# Patient Record
Sex: Male | Born: 1945 | Race: White | Hispanic: No | Marital: Married
Health system: Midwestern US, Academic
[De-identification: ages and names within clinical notes are randomized; demographics above are authoritative.]

---

## 2016-08-18 MED ORDER — MORPHINE 30 MG PO TBER
30 mg | ORAL_TABLET | Freq: Two times a day (BID) | ORAL | 0 refills | 7.00000 days | Status: DC
Start: 2016-08-18 — End: 2016-09-07

## 2016-08-18 MED ORDER — MORPHINE 15 MG PO TAB
15 mg | ORAL_TABLET | ORAL | 0 refills | 7.00000 days | Status: DC | PRN
Start: 2016-08-18 — End: 2016-09-07

## 2016-08-25 MED ORDER — ZOLEDRONIC AC-MANNITOL-0.9NACL 4 MG/100 ML IV PGBK
4 mg | Freq: Once | INTRAVENOUS | 0 refills | Status: CN
Start: 2016-08-25 — End: ?

## 2016-08-25 MED ORDER — LEUPROLIDE (3 MONTH) 22.5 MG IM SYKT
22.5 mg | Freq: Once | INTRAMUSCULAR | 0 refills | Status: CP
Start: 2016-08-25 — End: ?

## 2016-09-07 MED ORDER — MORPHINE 15 MG PO TAB
15 mg | ORAL_TABLET | ORAL | 0 refills | 7.00000 days | Status: DC | PRN
Start: 2016-09-07 — End: 2016-09-29

## 2016-09-07 MED ORDER — MORPHINE 30 MG PO TBER
30 mg | ORAL_TABLET | Freq: Two times a day (BID) | ORAL | 0 refills | 7.00000 days | Status: DC
Start: 2016-09-07 — End: 2016-09-29

## 2016-09-29 MED ORDER — ZOLEDRONIC AC-MANNITOL-0.9NACL 4 MG/100 ML IV PGBK
4 mg | Freq: Once | INTRAVENOUS | 0 refills | Status: CP
Start: 2016-09-29 — End: ?

## 2016-09-29 MED ORDER — MORPHINE 30 MG PO TBER
30 mg | ORAL_TABLET | Freq: Two times a day (BID) | ORAL | 0 refills | 7.00000 days | Status: DC
Start: 2016-09-29 — End: 2016-11-05

## 2016-09-29 MED ORDER — MORPHINE 15 MG PO TAB
15 mg | ORAL_TABLET | ORAL | 0 refills | 7.00000 days | Status: DC | PRN
Start: 2016-09-29 — End: 2016-11-05

## 2016-10-14 MED ORDER — ZOLEDRONIC AC-MANNITOL-0.9NACL 4 MG/100 ML IV PGBK
4 mg | Freq: Once | INTRAVENOUS | 0 refills | Status: CN
Start: 2016-10-14 — End: ?

## 2016-10-16 ENCOUNTER — Encounter: Admit: 2016-10-16 | Discharge: 2016-10-16 | Payer: MEDICARE

## 2016-10-16 NOTE — Telephone Encounter
Patients wife called to report patients weight on 5/21 was 216 lbs and today is 243 lbs.  She asks why this is and what to do.  This nurse spoke with NP  Jeanine Luz who said patient needs to be evaluated.  If patient has any symptoms over the weekend he should go directly to the emergency room, I.e. chest pains, shortness of breath.  Patient as an appointment with Dr. Mickie Hillier on Tuesday, 10/20/16.  Patients wife voiced understanding.

## 2016-10-20 ENCOUNTER — Encounter: Admit: 2016-10-20 | Discharge: 2016-10-20 | Payer: MEDICARE

## 2016-10-20 NOTE — Telephone Encounter
RN called patient and left VM to make sure he went to Emergency room since he was not at [facility] per their medical records.

## 2016-10-21 ENCOUNTER — Encounter: Admit: 2016-10-21 | Discharge: 2016-10-21 | Payer: MEDICARE

## 2016-10-21 NOTE — Telephone Encounter
Patient's wife apparently called and spoke with a scheduler.   She apparently requested and "emergency" appt and advised the scheduler ER was "useless" and she apparently refused to advise the scheduler what the appt was for.   I attempted to contact the patient to ascertain the problem, but had to leave a message.

## 2016-10-21 NOTE — Telephone Encounter
According to [facility] records, patient was admitted yesterday and a call to the facility confirmed he is still there.

## 2016-10-22 ENCOUNTER — Encounter: Admit: 2016-10-22 | Discharge: 2016-10-22 | Payer: MEDICARE

## 2016-10-22 DIAGNOSIS — I890 Lymphedema, not elsewhere classified: Principal | ICD-10-CM

## 2016-10-26 ENCOUNTER — Encounter: Admit: 2016-10-26 | Discharge: 2016-10-26 | Payer: MEDICARE

## 2016-10-26 NOTE — Telephone Encounter
Hospital Discharge Follow Up      Reached Patient:Yes     Admission Information:     Hospital Name: [facility]  Admission Date: 10/20/16  Discharge Date: 10/23/16  Discharge Diagnosis: UTI , Hematuria, Renal insufficiency, Peripheral edema, Prostate cancer  Was this a readmission? No  If yes, reason:   Hospital Services: Unplanned  Today's call is 1(business) days post discharge      Discharge Instruction Review   Did patient receive and understand discharge instructions? Yes    Home Health ordered? No                 Agency name/telephone number:    Has HH agency contacted patient? No   Caregiver assistance in the home? Yes   Are there concerns regarding the patient's ADL'S? No  Is patient a fall risk? Yes    Special diet? No If yes, type: pt states he has no appetitie      Medication Reconciliation    Changes to pre-hospital medications? Yes  Were new prescriptions filled?Yes, MSIR 15 mg prn, and Morphine ER, Ibuprofen    Understanding Condition   Having any current symptoms? Yes, Pain at back, legs, abdomen not controlled by current regimine of Morphine and Ibuprofen.  Pt states he gets 'a bit of relief by bedtime'. Pt is communicating with Dr. Houston Siren for pain management. Pt states he has swelling 'from chest down'., and is taking Lasix qam. Pt states he entered the hospital @246 #, and today weighs 225#. Pt has sob w/activity. Pt denies nausea,  but 'I don't want to eat anything'. Pt states he is voiding 'can't tell' if there is blood in his urine. Pt states his stools are loose r/t IV antibiotics recieved while inpatient for a UTI.  Patient understands when to seek additional medical care? Yes   Other instructions provided : Action in the event of worsening or new symptoms, resources including Dr. West Pugh, and Dr. Germaine Pomfret.      Scheduling Follow-up Appointment   Upcoming appointment date and time and with whom scheduled: Future Appointments Date Time Provider Department Center   10/29/2016 3:00 PM [facility] None   10/29/2016 3:30 PM Lurline Idol, APRN [facility] [place] Exam   11/04/2016 1:30 PM Moishe Spice, PhD [facility] Exam     PCP appointment scheduled?No, patient declined appointment  PCP primary location: [facility]  Specialist appointment scheduled? Yes, with Oncology Mikala [facility] 10/29/16  Both PCP and Specialist appointment scheduled: No  Is assistance with transportation needed?No

## 2016-10-29 ENCOUNTER — Encounter: Admit: 2016-10-29 | Discharge: 2016-10-29 | Payer: MEDICARE

## 2016-10-29 NOTE — Telephone Encounter
Patient called to report having 7 stools since 2:00 am this morning.  They are not watery and also not formed.  He has taken 4 Lomotil with no results. Patient denies fever but says he was cold last night and had the shakes. He is not eating much but is drinking.  Dr. Mickie Hillier notified and ordered [place].  Patient is allergic to codeine and tapentadol.  Dr. Mickie Hillier then said to use Pepto Bismol, probiotics and to eat yogurt. This nurse called patient and he has Tincture of Opium but hasn't used it.  He takes probiotics and is allergic to  milk so will not eat yogurt.  This nurse told him to take the Tincture of Opium.  Patient has an appointment at 3:00 pm with NP, Mikala and will try to keep that appointment if he can.

## 2016-11-04 ENCOUNTER — Encounter: Admit: 2016-11-04 | Discharge: 2016-11-04 | Payer: MEDICARE

## 2016-11-04 DIAGNOSIS — C61 Malignant neoplasm of prostate: ICD-10-CM

## 2016-11-04 DIAGNOSIS — C7951 Secondary malignant neoplasm of bone: Principal | ICD-10-CM

## 2016-11-04 NOTE — Progress Notes
Patient name:  Allison Silva  DOB:    July 05, 1945  MRN:    [id number]  Date of service:  May 9th 2018, June 20th 2018    Reason for referral: Personal and family history of cancer.      Referring physician: Dr Christen Bame.    Nyaire is a 71 year old man referred to Genetics for evaluation of inherited cancer syndromes. Delante was diagnosed with prostate cancer @71  with a Gleason score of 10. 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 He has had colonoscopies with 2-3 polyps.      The following germline genetic test was performed:  [facility] Multi-cancer panel: ALK, APC, ATM, AXIN2, BAP1, BARD1, BLM, BMPR1A, BRCA1, BRCA2, BRIP1, CASR, CDC73, CDH1, CDK4, CDKN1B, CDKN1C, CDKN2A (p14ARF), CDKN2A (p16INK4a), CEBPA, CHEK2, CTNNA1, DICER1, DIS3L2, EGFR, EPCAM (Deletion/duplication testing only), FH, FLCN, GATA2, GPC3, GREM1 (Promoter region deletion/duplication testing only), HOXB13, HRAS, KIT, MAX, MEN1, MET, MITF, MLH1, MSH2, MSH6, MUTYH, NBN, NF1, NF2, PALB2, PDGFRA, PHOX2B, PMS2, POLD1, POLE, POT1, PRKAR1A, PTCH1, PTEN, RAD50, RAD51C, RAD51D, RB1, RECQL4, RET, RUNX1, SDHA, SDHAF2, SDHB, SDHC, SDHD, SMAD4, SMARCA4, SMARCB1, SMARCE1, STK11, SUFU, TERC, TERT, TMEM127, TP53, TSC1, TSC2, VHL, WRN, WT1.    The test identified three variants of uncertain significance as follows:  FLCN  c.1274A>G  p.Gln425Arg  heterozygous  MSH6  c.3758T>A  p.Val1253Glu  heterozygous  TSC1  c.2723G>A  p.Arg908Gln  heterozygous    Since these changes are VUSs we do not advise altering current clinical management based on this result. We advise no further testing of other family members. As and when this variant is reclassified we will advise the patient accordingly. It is also possible to monitor the status of this variant using ClinVar ([email or web address]).We would like to emphasize that all of the other genes on the panel tested negative.    No pathogenic variants were detected by sequencing in any of the other genes on this panel. No large deletions or duplications were identified by array [facility].  This negative result does not exclude a genetic basis for the reported personal and/or family history of cancer in this patient. It is possible that this patient has a pathogenic mutation that is not detectable by this analysis or in a gene that is not included on the panel.     Counseling performed by:  Moishe Spice PhD MS [facility],   Clinical Assistant Professor,   Board Certified Genetic Counselor,   [email or web address]

## 2016-11-05 ENCOUNTER — Encounter: Admit: 2016-11-05 | Discharge: 2016-11-05 | Payer: MEDICARE

## 2016-11-05 DIAGNOSIS — F4310 Post-traumatic stress disorder, unspecified: ICD-10-CM

## 2016-11-05 DIAGNOSIS — Z09 Encounter for follow-up examination after completed treatment for conditions other than malignant neoplasm: ICD-10-CM

## 2016-11-05 DIAGNOSIS — R300 Dysuria: ICD-10-CM

## 2016-11-05 DIAGNOSIS — I890 Lymphedema, not elsewhere classified: ICD-10-CM

## 2016-11-05 DIAGNOSIS — G893 Neoplasm related pain (acute) (chronic): Secondary | ICD-10-CM

## 2016-11-05 DIAGNOSIS — Z87442 Personal history of urinary calculi: Principal | ICD-10-CM

## 2016-11-05 DIAGNOSIS — H9190 Unspecified hearing loss, unspecified ear: ICD-10-CM

## 2016-11-05 DIAGNOSIS — M1990 Unspecified osteoarthritis, unspecified site: ICD-10-CM

## 2016-11-05 DIAGNOSIS — K580 Irritable bowel syndrome with diarrhea: ICD-10-CM

## 2016-11-05 DIAGNOSIS — C7951 Secondary malignant neoplasm of bone: ICD-10-CM

## 2016-11-05 DIAGNOSIS — C61 Malignant neoplasm of prostate: Principal | ICD-10-CM

## 2016-11-05 DIAGNOSIS — M549 Dorsalgia, unspecified: ICD-10-CM

## 2016-11-05 DIAGNOSIS — K319 Disease of stomach and duodenum, unspecified: ICD-10-CM

## 2016-11-05 DIAGNOSIS — R600 Localized edema: ICD-10-CM

## 2016-11-05 LAB — CBC AND DIFF
Lab: 1 %
Lab: 14 % (ref 11–15)
Lab: 17 % — ABNORMAL HIGH (ref 0–10)
Lab: 2 %
Lab: 2 % (ref >60)
Lab: 26 % (ref 24–44)
Lab: 29 pg (ref 26–34)
Lab: 33 g/dL (ref 32.0–36.0)
Lab: 34 % — ABNORMAL LOW (ref 40–50)
Lab: 399 10*3/uL — ABNORMAL HIGH (ref 150–400)
Lab: 4 M/UL — ABNORMAL LOW (ref 4.4–5.5)
Lab: 4.4 10*3/uL (ref 1.8–7.0)
Lab: 46 % (ref 41–77)
Lab: 6 % (ref >60)
Lab: 7.1 10*3/uL (ref 4.5–11.0)
Lab: 7.4 FL (ref 7–11)

## 2016-11-05 MED ORDER — FLUOCINONIDE 0.05 % TP CREA
1 refills | Status: AC | PRN
Start: 2016-11-05 — End: ?

## 2016-11-05 MED ORDER — MORPHINE 15 MG PO TBER
15 mg | ORAL_TABLET | Freq: Two times a day (BID) | ORAL | 0 refills | 7.00000 days | Status: AC
Start: 2016-11-05 — End: 2017-01-05

## 2016-11-05 MED ORDER — MORPHINE 30 MG PO TBER
30 mg | ORAL_TABLET | Freq: Two times a day (BID) | ORAL | 0 refills | 7.00000 days | Status: AC
Start: 2016-11-05 — End: 2016-12-01

## 2016-11-05 MED ORDER — MORPHINE 15 MG PO TAB
15 mg | ORAL_TABLET | ORAL | 0 refills | 7.00000 days | Status: AC | PRN
Start: 2016-11-05 — End: 2016-12-01

## 2016-11-05 NOTE — Progress Notes
Name: Jose Hampton          MRN: [id number]      DOB: [birth year]      AGE: 71 y.o.   DATE OF SERVICE: 11/05/2016    Subjective:             Reason for Visit:  Heme/Onc Care      Jose Hampton is a 71 y.o. male.     Cancer Staging  No matching staging information was found for the patient.    History of Present Illness  71 year old male patient of Dr. Lowella Dell who comes in for hospital follow up. He was in [facility] from 10/20/16-10/23/16 for lymphedema, had declined diuretics as outpatient because he thought that the Lasix caused constipation. He uses a lymphedema suit from the [place] but had gained 30 pounds in the last month and could not actually fit into the suit. Admitted under observation. Received 2 days of IV Lasix with 15 pound weight loss. Upon discharge, will continue Lasix 40 mg by mouth daily. Electrolytes remained stable. Creatinine actually improved with diuresis. Described urinary retention, on finasteride. Flomax added. He has follow-up with urology already in place in 1 month. He comes in today with his wife. He reports some dysuria-which has been going on since 10/05/16. Reportedly had a UTI in [facility] and was given IV antibiotics. His swelling in his legs is better but reports his pain is not adequately controlled.     He is followed by Dr. Lowella Dell for metastatic prostate cancer. He started a 30 day course of casodex (started 02/26/16) and began therapy with Lupron on March 10, 2016.  He was instructed to start calcium vitamin D.  Patient started docetaxel chemotherapy on 03/16/16.  The plan was for a total of 6 cycles of docetaxel therapy. PSA was 22.62 on 03/02/16. CT abdomen and pelvis from 06/09/16 showed widespread sclerotic osseous metastatic disease, Normal size prostate with no abdominopelvic lymphadenopathy, nonobstructing right renal calculus, and multiple calcifications noted along the posterior wall the urinary bladder which was incompletely distended. These likely represent multiple small layering bladder calculi. However, partial calcified posterior bladder wall mass cannot be excluded as well as mild colonic diverticulosis and small hiatal hernia. Due to intense swelling, stopped the docetaxel. PET scan from 07/10/16 reported ???low level FDG uptake within minimal retroperitoneal soft tissue thickening, which is not significantly changed in size since CT abdomen pelvis 06/09/2016 and is favored to reflect small reactive lymph nodes or treated disease, no thoracic adenopathy, widespread osteosclerotic metastatic disease, some of which demonstrate low level increased FDG uptake and nonobstructing right renal calculus and redemonstration of multiple   layering calcifications dependently or along the posterior bladder wall  favored to reflect bladder calculi. PSA from 09/23/16 was 0.20.        Review of Systems   Constitutional: Positive for activity change, appetite change, diaphoresis and fatigue.   HENT: Negative.    Eyes: Negative.    Respiratory: Negative.    Cardiovascular: Positive for leg swelling.   Gastrointestinal: Positive for abdominal pain, constipation and diarrhea.   Genitourinary: Negative.    Musculoskeletal: Positive for gait problem.   Skin: Negative.         dry   Neurological: Positive for weakness.   Psychiatric/Behavioral: Negative.          Objective:         ??? ciclopirox (LOPROX) 0.77 % topical cream Apply  to affected area twice daily.   ??? colesevelam(+) (  WELCHOL) 625 mg tablet Take 3 Tabs by mouth twice daily with meals.   ??? dicyclomine (BENTYL) 10 mg capsule Take 10 mg by mouth four times daily.   ??? finasteride (PROSCAR) 5 mg tablet Take 5 mg by mouth daily.   ??? fluocinonide (LIDEX) 0.05 % topical cream Apply to affected area as needed.   ??? Hydrocortisone-Pramoxine (PRAMOSONE) 2.5-1 % oint Apply  to affected area as Needed (apply sparingly to rectum).   ??? ketotifen(+) (ZADITOR) 0.025 % (0.035 %) ophthalmic solution Apply 1 Drop to both eyes twice daily. ??? LACTOBACILLUS ACIDOPHILUS (ACIDOPHILUS PO) Take  by mouth.   ??? morphine IR (MS-IR) 15 mg tablet Take 1 tablet by mouth every 3 hours as needed for Pain (for pain)   ??? morphine SR (MS CONTIN) 15 mg tablet Take 1 tablet by mouth every 12 hours   ??? morphine SR (MS CONTIN; ORAMORPH SR) 30 mg ER tablet Take 1 tablet by mouth every 12 hours   ??? ondansetron (ZOFRAN) 8 mg tablet Take 1 tablet by mouth every 8 hours as needed for Nausea or Vomiting.   ??? Sulfacetamide Sodium-Sulfur 10-5 % (w/v) lotn Apply  topically to affected area.   ??? zolpidem (AMBIEN) 10 mg tablet Take 10 mg by mouth at bedtime as needed for Sleep.     Vitals:    11/05/16 1323   BP: 98/62   Pulse: 73   Resp: 18   Temp: 36.7 ???C (98.1 ???F)   TempSrc: Oral   SpO2: 98%   Weight: 98.6 kg (217 lb 6.4 oz)   Height: 175.3 cm (69.02)     Body mass index is 32.09 kg/m???.     Pain Score: Ten  Pain Loc: Hip (neck and legs)      Pain Addressed:  Prescription provided for pain management    Patient Evaluated for a Clinical Trial: Patient not eligible for a treatment trial (including not needing treatment, needs palliative care, in remission).     [place] Cooperative Oncology Group performance status is 1, Restricted in physically strenuous activity but ambulatory and able to carry out work of a light or sedentary nature, e.g., light house work, office work.     Physical Exam   Constitutional: He is oriented to person, place, and time. He appears well-developed and well-nourished. No distress.   HENT:   Head: Normocephalic and atraumatic.   Mouth/Throat: Oropharynx is clear and moist.   Eyes: EOM are normal. Pupils are equal, round, and reactive to light.   Neck: Normal range of motion. Neck supple.   Cardiovascular: Normal rate and normal heart sounds.    Pulmonary/Chest: Effort normal and breath sounds normal.   Abdominal: Soft. Bowel sounds are normal.   Musculoskeletal: Normal range of motion. He exhibits edema (1+ bilateral). Neurological: He is alert and oriented to person, place, and time.   Skin: Skin is warm and dry.   Thickened skin from chronic edema on both lower legs. Some cracking and peeling at posterior shins, no drainage, less painful   Psychiatric: He has a normal mood and affect. His behavior is normal. Judgment and thought content normal.     CBC w/Diff    Lab Results   Component Value Date/Time    WBC 7.1 11/05/2016 01:15 PM    RBC 4.04 (L) 11/05/2016 01:15 PM    HGB 11.7 (L) 11/05/2016 01:15 PM    HCT 34.9 (L) 11/05/2016 01:15 PM    MCV 86.5 11/05/2016 01:15 PM    MCH  29.0 11/05/2016 01:15 PM    MCHC 33.6 11/05/2016 01:15 PM    RDW 14.8 11/05/2016 01:15 PM    PLTCT 399 11/05/2016 01:15 PM    MPV 7.4 11/05/2016 01:15 PM    Lab Results   Component Value Date/Time    NEUT 59 09/23/2016 01:18 PM    ANC 4.48 11/05/2016 01:15 PM    ANC 3.10 09/23/2016 01:18 PM    LYMA 25 09/23/2016 01:18 PM    ALC 1.30 09/23/2016 01:18 PM    MONA 13 (H) 09/23/2016 01:18 PM    AMC 0.70 09/23/2016 01:18 PM    EOSA 3 09/23/2016 01:18 PM    AEC 0.20 09/23/2016 01:18 PM    BASA 0 09/23/2016 01:18 PM    ABC 0.00 09/23/2016 01:18 PM      U/A pending             Assessment and Plan:  1. Metastatic prostate cancer. Due for Lupron on 7/3 (q 3 months). Patient started docetaxel chemotherapy on 03/16/16.  The plan was for a total of 6 cycles of docetaxel therapy. PSA was 22.62 on 03/02/16 Due to intense swelling, stopped the docetaxel. PSA from 09/23/16 was 0.20.PSA pending today. Will order CT AP and PET and have him follow up with Dr. Lowella Dell. He prefers scans at [facility] as the table is easier on his back. Follow up with Dr. Lowella Dell after scans. Future appointments in place. .  2. Lower leg edema with recent hospital stay (6/5-6/8). Much improvement in edema. Able to wear compression device. Will wean down to 20 mg daily. CMP pending.   3. Kidney stones; Patient had surgery to remove bladder stones by urology on 08/28/16.Repeatd again on 10/05/16. 4. Dysuria; since cysto on 5/21, recently completed treatment for UTI in hospital. On Flomax and proscar. Will check UA today  5. Pain; reports MS contin 30 mg not strong enough, but 60 mg too strong. Will provide him with an additional 15 mg MS Contin to bring him up to 45 mg BID. Continue MSIR for breakthrough   6. Bone metastasis. Got one dose of Zometa on 09/29/16, reports that caused worsening swelling of arms and legs. Does not want more at this time. Will discuss with Dr. Lowella Dell about other options. Remains on vitamin D with calcium.     Patient verbalized understanding and agreement with the plan.

## 2016-11-06 ENCOUNTER — Encounter: Admit: 2016-11-06 | Discharge: 2016-11-06 | Payer: MEDICARE

## 2016-11-06 LAB — URINALYSIS DIPSTICK
Lab: 1 U/L (ref 1.003–1.035)
Lab: 6 U/L (ref 5.0–8.0)
Lab: NEGATIVE meq/L (ref 21–32)
Lab: NEGATIVE mg/dL (ref 0.40–1.00)
Lab: NEGATIVE
Lab: NEGATIVE
Lab: NEGATIVE

## 2016-11-06 LAB — URINALYSIS, MICROSCOPIC

## 2016-11-06 LAB — PROSTATIC SPECIFIC ANTIGEN-PSA: Lab: 1 ng/mL — ABNORMAL LOW (ref <4.01)

## 2016-11-06 LAB — COMPREHENSIVE METABOLIC PANEL
Lab: 140 MMOL/L (ref 137–147)
Lab: 4.1 MMOL/L (ref 3.5–5.1)

## 2016-11-06 LAB — TESTOSTERONE,TOTAL: Lab: <10 ng/dL — ABNORMAL LOW (ref 270–1070)

## 2016-11-12 ENCOUNTER — Encounter: Admit: 2016-11-12 | Discharge: 2016-11-12 | Payer: MEDICARE

## 2016-11-12 DIAGNOSIS — C7951 Secondary malignant neoplasm of bone: ICD-10-CM

## 2016-11-12 DIAGNOSIS — C61 Malignant neoplasm of prostate: Principal | ICD-10-CM

## 2016-11-17 ENCOUNTER — Encounter: Admit: 2016-11-17 | Discharge: 2016-11-17 | Payer: MEDICARE

## 2016-11-17 DIAGNOSIS — C61 Malignant neoplasm of prostate: Principal | ICD-10-CM

## 2016-11-17 DIAGNOSIS — Z79818 Long term (current) use of other agents affecting estrogen receptors and estrogen levels: ICD-10-CM

## 2016-11-17 DIAGNOSIS — C7951 Secondary malignant neoplasm of bone: ICD-10-CM

## 2016-11-17 MED ORDER — LEUPROLIDE (3 MONTH) 22.5 MG IM SYKT
22.5 mg | Freq: Once | INTRAMUSCULAR | 0 refills | Status: CN
Start: 2016-11-17 — End: ?

## 2016-11-17 MED ORDER — LEUPROLIDE (3 MONTH) 22.5 MG IM SYKT
22.5 mg | Freq: Once | INTRAMUSCULAR | 0 refills | Status: CP
Start: 2016-11-17 — End: ?
  Administered 2016-11-17: 20:00:00 22.5 mg via INTRAMUSCULAR

## 2016-11-17 NOTE — Progress Notes
Pt presents today for injection. Pt tolerated without difficulty. Pt released in stable condition.

## 2016-11-20 ENCOUNTER — Encounter: Admit: 2016-11-20 | Discharge: 2016-11-20 | Payer: MEDICARE

## 2016-11-20 DIAGNOSIS — C7951 Secondary malignant neoplasm of bone: ICD-10-CM

## 2016-11-20 DIAGNOSIS — C61 Malignant neoplasm of prostate: Principal | ICD-10-CM

## 2016-12-01 ENCOUNTER — Encounter: Admit: 2016-12-01 | Discharge: 2016-12-01 | Payer: MEDICARE

## 2016-12-01 ENCOUNTER — Encounter: Admit: 2016-12-01 | Discharge: 2016-12-02

## 2016-12-01 DIAGNOSIS — C61 Malignant neoplasm of prostate: Secondary | ICD-10-CM

## 2016-12-01 DIAGNOSIS — Z87442 Personal history of urinary calculi: Principal | ICD-10-CM

## 2016-12-01 DIAGNOSIS — H9190 Unspecified hearing loss, unspecified ear: ICD-10-CM

## 2016-12-01 DIAGNOSIS — C7951 Secondary malignant neoplasm of bone: ICD-10-CM

## 2016-12-01 DIAGNOSIS — G893 Neoplasm related pain (acute) (chronic): ICD-10-CM

## 2016-12-01 DIAGNOSIS — M549 Dorsalgia, unspecified: ICD-10-CM

## 2016-12-01 DIAGNOSIS — K319 Disease of stomach and duodenum, unspecified: ICD-10-CM

## 2016-12-01 DIAGNOSIS — F4310 Post-traumatic stress disorder, unspecified: ICD-10-CM

## 2016-12-01 DIAGNOSIS — M1990 Unspecified osteoarthritis, unspecified site: ICD-10-CM

## 2016-12-01 LAB — CBC AND DIFF
Lab: 11 g/dL — ABNORMAL LOW (ref 13.5–16.5)
Lab: 15 % — ABNORMAL HIGH (ref >60)
Lab: 29 pg — ABNORMAL HIGH (ref 26–34)
Lab: 34 % — ABNORMAL LOW (ref 40–50)
Lab: 4 M/UL — ABNORMAL LOW (ref 4.4–5.5)
Lab: 5.4 K/UL — ABNORMAL LOW (ref 4.5–11.0)

## 2016-12-01 MED ORDER — ABIRATERONE 500 MG PO TAB
1000 mg | ORAL_TABLET | Freq: Every day | ORAL | 3 refills | Status: CN
Start: 2016-12-01 — End: ?

## 2016-12-01 MED ORDER — MORPHINE 30 MG PO TBER
30 mg | ORAL_TABLET | Freq: Two times a day (BID) | ORAL | 0 refills | 7.00000 days | Status: AC
Start: 2016-12-01 — End: 2017-01-05

## 2016-12-01 MED ORDER — MORPHINE 15 MG PO TAB
15 mg | ORAL_TABLET | ORAL | 0 refills | 7.00000 days | Status: AC | PRN
Start: 2016-12-01 — End: 2017-01-05

## 2016-12-01 MED ORDER — PREDNISONE 5 MG PO TAB
5 mg | ORAL_TABLET | Freq: Two times a day (BID) | ORAL | 3 refills | Status: AC
Start: 2016-12-01 — End: 2017-06-15
  Filled 2016-12-01: qty 60, 30d supply
  Filled 2016-12-02 (×2): qty 60, 30d supply, fill #1

## 2016-12-01 MED ORDER — ABIRATERONE 500 MG PO TAB
1000 mg | ORAL_TABLET | Freq: Every day | ORAL | 3 refills | Status: AC
Start: 2016-12-01 — End: 2017-06-25
  Filled 2016-12-02 (×2): qty 60, 30d supply, fill #1

## 2016-12-01 MED ORDER — ZOLEDRONIC AC-MANNITOL-0.9NACL 4 MG/100 ML IV PGBK
4 mg | Freq: Once | INTRAVENOUS | 0 refills | Status: AC
Start: 2016-12-01 — End: ?

## 2016-12-01 MED ORDER — PREDNISONE 5 MG PO TAB
5 mg | ORAL_TABLET | Freq: Two times a day (BID) | ORAL | 3 refills | Status: CN
Start: 2016-12-01 — End: ?

## 2016-12-01 MED ORDER — FUROSEMIDE 40 MG PO TAB
40 mg | ORAL_TABLET | Freq: Every morning | ORAL | 3 refills | 90.00000 days | Status: AC
Start: 2016-12-01 — End: 2017-01-05

## 2016-12-01 NOTE — Progress Notes
Initial Assessment: Oral Chemotherapy  Abiraterone (Zytiga???) and Prednisone    Jose Hampton is a 71 y.o. male with a diagnosis of metastatic prostate cancer.    Indication/Regimen    Abiraterone (Zytiga???) is being used appropriately for treatment of metastatic prostate cancer.      The dosing regimen of 1000 mg (two 500-mg tablets) by mouth once daily is appropriate for [employer]. It is planned to continue until progression or unacceptable toxicity.  Abiraterone (Zytiga???) is being prescribed in combination with prednisone 5 mg by mouth twice daily.      Patient History:   Cancer Diagnosis: Metastatic prostate cancer    Relevant drug-specific markers:    PSA: 0.2 (09/23/16), 1.03 (11/05/16)   Past treatment regimens: Casodex, Lupron, Docetaxel   Past Treatment Plans    ONCOLOGY 1   Plan Name Cycles Start Date Discontinue Date Discontinue Reason Discontinue User    OP GU DOCETAXEL  5 of 6 cycles started 03/16/2016 06/30/2016 Toxicity Christen Bame, DO   ONCOLOGY 2   Plan Name Cycles Start Date Discontinue Date Discontinue Reason Discontinue User    OP SUPPORT ZOLEDRONIC ACID (21 DAYS) 2 of 17 cycles started 09/01/2016 12/01/2016 Toxicity Christen Bame, DO          Wt Readings from Last 1 Encounters:   12/01/16 98.7 kg (217 lb 9.6 oz)        Estimated body surface area is 2.19 meters squared as calculated from the following:    Height as of an earlier encounter on 12/01/16: 175.3 cm (69.02).    Weight as of an earlier encounter on 12/01/16: 98.7 kg (217 lb 9.6 oz).    Allergies:  Allergies   Allergen Reactions   ??? Penicillin G HIVES   ??? Egg HIVES   ??? Ketotifen EYE IRRITATION   ??? Milk HIVES   ??? Codeine NAUSEA AND VOMITING   ??? Mesalamine DIARRHEA   ??? Tapentadol RASH   ??? Tylenol [Acetaminophen] DIARRHEA   ??? Bacitracin UNKNOWN   ??? Diphenhydramine UNKNOWN   ??? Doxepin UNKNOWN   ??? Doxycycline STOMACH UPSET   ??? Finasteride UNKNOWN   ??? Pravastatin DIARRHEA   ??? Rifaximin DIARRHEA   ??? Rosuvastatin DIARRHEA ??? Simvastatin DIARRHEA and UNKNOWN       Baseline Labs:     CBC w/Diff    Lab Results   Component Value Date/Time    WBC 5.4 12/01/2016 12:41 PM    RBC 4.00 (L) 12/01/2016 12:41 PM    HGB 11.9 (L) 12/01/2016 12:41 PM    HCT 34.5 (L) 12/01/2016 12:41 PM    MCV 86.3 12/01/2016 12:41 PM    MCH 29.6 12/01/2016 12:41 PM    MCHC 34.4 12/01/2016 12:41 PM    RDW 15.3 (H) 12/01/2016 12:41 PM    PLTCT 290 12/01/2016 12:41 PM    MPV 7.2 12/01/2016 12:41 PM    Lab Results   Component Value Date/Time    NEUT 62 12/01/2016 12:41 PM    ANC 3.30 12/01/2016 12:41 PM    LYMA 21 (L) 12/01/2016 12:41 PM    ALC 1.10 12/01/2016 12:41 PM    MONA 12 12/01/2016 12:41 PM    AMC 0.70 12/01/2016 12:41 PM    EOSA 5 12/01/2016 12:41 PM    AEC 0.30 12/01/2016 12:41 PM    BASA 0 12/01/2016 12:41 PM    ABC 0.00 12/01/2016 12:41 PM          Comprehensive Metabolic Profile  Lab Results   Component Value Date/Time    NA 140 11/05/2016 01:15 PM    K 4.1 11/05/2016 01:15 PM    CL 105 11/05/2016 01:15 PM    CO2 27 11/05/2016 01:15 PM    GAP 8 11/05/2016 01:15 PM    BUN 20 11/05/2016 01:15 PM    CR 1.01 11/05/2016 01:15 PM    GLU 106 (H) 11/05/2016 01:15 PM    Lab Results   Component Value Date/Time    CA 9.0 11/05/2016 01:15 PM    ALBUMIN 3.7 11/05/2016 01:15 PM    TOTPROT 6.5 11/05/2016 01:15 PM    ALKPHOS 195 (H) 11/05/2016 01:15 PM    AST 19 11/05/2016 01:15 PM    ALT 12 11/05/2016 01:15 PM    TOTBILI 0.4 11/05/2016 01:15 PM    GFR >60 11/05/2016 01:15 PM    GFRAA >60 11/05/2016 01:15 PM        Serum creatinine: 1.01 mg/dL 16/10/96 0454  Estimated creatinine clearance: 80 mL/min    Pregnancy status    As patient is a male, education will be provided regarding adequate contraception for male partners of reproductive potential and contacting his physician immediately should his partner become pregnant.     Medication Reconciliation    Home Medications    Medication Sig   abiraterone (ZYTIGA) 500 mg tablet Take 1,000 mg by mouth daily. Take on an empty stomach, at least 1 hour before or 2 hours after food.   ciclopirox (LOPROX) 0.77 % topical cream Apply  to affected area twice daily.   colesevelam(+) (WELCHOL) 625 mg tablet Take 3 Tabs by mouth twice daily with meals.   dicyclomine (BENTYL) 10 mg capsule Take 10 mg by mouth four times daily.   finasteride (PROSCAR) 5 mg tablet Take 5 mg by mouth daily.   fluocinonide (LIDEX) 0.05 % topical cream Apply to affected area as needed.   furosemide (LASIX) 40 mg tablet Take 1 tablet by mouth every morning.   Hydrocortisone-Pramoxine (PRAMOSONE) 2.5-1 % oint Apply  to affected area as Needed (apply sparingly to rectum).   ketotifen(+) (ZADITOR) 0.025 % (0.035 %) ophthalmic solution Apply 1 Drop to both eyes twice daily.   LACTOBACILLUS ACIDOPHILUS (ACIDOPHILUS PO) Take  by mouth.   morphine IR (MS-IR) 15 mg tablet Take 1 tablet by mouth every 3 hours as needed for Pain (for pain)   morphine SR (MS CONTIN) 15 mg tablet Take 1 tablet by mouth every 12 hours   morphine SR (MS CONTIN; ORAMORPH SR) 30 mg ER tablet Take 1 tablet by mouth every 12 hours   ondansetron (ZOFRAN) 8 mg tablet Take 1 tablet by mouth every 8 hours as needed for Nausea or Vomiting.   prednisone (DELTASONE) 5 mg tablet Take 1 tablet by mouth twice daily with meals.   Sulfacetamide Sodium-Sulfur 10-5 % (w/v) lotn Apply  topically to affected area.   zolpidem (AMBIEN) 10 mg tablet Take 10 mg by mouth at bedtime as needed for Sleep.       Medication reconciliation is based on the patient???s most recent medication list in the electronic medical record (EMR) including herbal products and OTC medications. The patient's medication list will be updated during patient education, after speaking with the patient and prior to dispensing the medication.     Drug-drug interactions (DDIs)    DDIs were evaluated: No significant drug-drug interactions were identified.     Follow up plan: will discuss with Patient and determine if alternative therapy  is appropriate.      Drug-Food Interactions    Drug-food interactions were evaluated.  Doses should be administered on an empty stomach, at least 1 hour before and 2 hours after food.    Contraindications    No contraindications to therapy were identified as there are no contraindications in the prescribing information.     Safety Precautions    The following safety precautions to the use of Abiraterone (Zytiga???) were reviewed:  ??? Adrenocortical insufficiency  ??? Hepatotoxicity  ??? Mineralocorticoid excess (hypertension, hypokalemia, fluid retention)  ??? Cardiovascular disease    Safety precautions for this medication have been reviewed. No concerns have been identified.     Risk Evaluation and Mitigation Strategy (REMS) Assessment    No REMS is required for this medication.     Initial therapy assessment has been completed and the patient will be contacted to complete education on their regimen.     Franchot Mimes, [facility]  Clinical Pharmacist  12/01/16

## 2016-12-01 NOTE — Progress Notes
Name: Jose Hampton          MRN: [id number]      DOB: 29-Aug-1945      AGE: 72 y.o.   DATE OF SERVICE: 12/01/2016    Subjective:             Reason for Visit:   Heme/Onc Care      Jose Hampton is a 71 y.o. male who presents to my oncology clinic for follow up for his metastatic prostate cancer.     History of Present Illness  Oncological history:  He has long-standing chronic pain for which was previously followed and a pain clinic and had been receiving injections for his back, shoulder and leg pain.  He was a transfer care to pain clinic in [facility] and so had an MRI of the lumbar spine performed which showed a number lesions in the lumbar vertebral bodies suspicious for sclerotic metastatic lesions.  He further had this worked up with a bone scan which showed extensive metastatic disease involving the thoracic and lumbar spine, ribs, pelvis proximal Femara and right humerus.  Patient had not had a prostate biopsy performed and noted to urology that his last rectal exam was approximately 3 years ago.  Patient stated that he had surgery to his rectum to urology to make him have loose stools for multiple days after having a rectal exam done.    Per urology's notes his PSA was 9.4.    An MRI of the cervical spine from January 13, 2016:  Spondylolysis C4 through C6.  Straightening of the lordotic curvature and evidence of subtle retrolisthesis of C4 upon C5.  C4-C5 severe degenerative disc disease with central osteophyte or disc complex.  C6-C7 mild degenerative disc disease.    MRI of the lumbar spine from January 13, 2016:  There are multiple rounded lesions in the lumbar vertebral bodies with decreased T1-T2 signal suspicious for sclerotic metastatic lesions.  Multilevel degenerative disc disease with varying degrees of spinal stenosis.    A bone scan from January 30, 2016:  Findings consistent with extensive metastatic disease involving the thoracic and lumbar spine, ribs, pelvis and proximal Femara and right humerus.  Activity in several joints most likely arthritic    Pathology from March 03, 2016:  Prostate left apex biopsy: Prostatic adenocarcinoma Gleason grade 5+5 equals score 10 and 1 core involving 80% of needle core tissue measuring 7 mm in length.  Perineural invasion present.  Prostate left mid biopsy:  Prostatic adenocarcinoma Gleason grade 5+5 equals score 10 and 1 core involving 80% and needle core of tissue measuring 7 mm in length.  Prostate left base biopsy:  Prostatic adenocarcinoma Gleason grade 5+5 equals score 10 and 1 core involving 60% the needle core tissue measuring 7 mm in length.  Perineural invasion present.  Prostate left lateral apex biopsy:  Prostatic adenocarcinoma Gleason grade 5+5 equals were 10 and 1 core involving 95% and needle core tissue, measuring 10 mm in length.  Prostate left lateral mid biopsy:  Prostatic adenocarcinoma Gleason grade 5+5 equals were 10 and 1 core, involving 60% and needle core tissue, measuring 6 mm in length.  Perineural invasion present.  Prostate left lateral base biopsy:  Prostatic adenocarcinoma Gleason grade 5+5 score 10 and 1 core involving 90% needle core tissue, measuring 10 mm in length.  Prostate right apex biopsy:  Prostatic adenocarcinoma Gleason grade 5+5 and glucose 410 and 1 core involving 100% and needle core tissue, measuring 11 mm in length.  Prostate right mid biopsy:  Prostatic adenocarcinoma Gleason grade 5+5 he was scored 10 and 1 core involving 80% the needle core tissue, measuring 8 mm in length.  Prostate right base biopsy:  Prostatic adenocarcinoma Gleason grade 5+5 score 10 and 1 core involving 90% and needle core tissue measuring 12 mm in length.  Prostate right lateral apex biopsy:  Prostatic adenocarcinoma Gleason grade 5+5 equals were 10 and 1 core involving 80% of the needle core tissue measuring 9 mm in length. Prostate right lateral mid biopsy: Prostatic adenocarcinoma Gleason grade 5+5 score 10 and 1 core involving 75% and needle core tissue, measuring 9 mm in length.  Prostate right lateral base biopsy: Prostatic adenocarcinoma Gleason grade 5+5 equals were 10 and 1 core involving 80% and needle core tissue, measuring 9 mm in length.    He has started a 30 day course of casodex (02/26/16) and is scheduled to begin therapy with Lupron on March 10, 2016.  He was instructed to start calcium vitamin D to total 1200 mg of calcium 810,000 international units of vitamin D daily in divided doses.  Patient is also to start docetaxel chemotherapy.  The plan is for a total of 6 cycles of docetaxel therapy.    Started docetaxel on 03/16/16      05/03/16-12/19: Admitted to [facility] for bloody diarrhea. ???He was found to have colitis, presumed to be due to treatment vs. Infection. ???He was placed on Levaquin and discharged on 12/19.???He has a hx of IBS-D.???  ???  05/16/16: Went to the ED at [facility]??for BLE swelling. ???Bilateral doppler was negative for VTE. ???He was sent home and has been using lasix for a few days for swelling.  ???  RADIATION TREATMENT SUMMARY   SITE TREATED Method Number of  Treatments Dose per  Fraction Total  Dose   L1 Through L Prox Femur  Right Proximal Femur AP-PA 18X  AP-PA 18X 5  5 400 cGy  400 cGy 2000 cGy  2000 cGy   Total Radiation Dose 2000 cGy in 5 Radiation Treatments.   Radiation Initiated  Radiation Completed 04/20/16  04/24/16      I lowered the dose of docetaxel to 60 mg/m2 secondary to leg swelling      CT of Abdomen/Pelvis 06/09/16  IMPRESSION    1. Widespread sclerotic osseous metastatic disease.  2. Normal size prostate with no abdominopelvic lymphadenopathy.  3. 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  4. Nonobstructing right renal calculus.  5. Multiple calcifications noted along the posterior wall the urinary bladder which is incompletely distended. These likely represent multiple   small layering bladder calculi. However, partial calcified posterior   bladder wall mass cannot be excluded. Clinical correlation is recommended.   Correlation with bladder ultrasound is recommended if clinically   indicated.  6. Mild colonic diverticulosis and small hiatal hernia.    Echo from 06/17/16:  Interpretation Summary     Left ventricular systolic function is within normal limits.  LVEF 60%  No significant valvular abnormalities.  No pericardial effusion.   Normal diastolic function.     PET scan from 07/10/16:  IMPRESSION    ???  1. ???Low level FDG uptake within minimal retroperitoneal soft tissue   thickening, which is not significantly changed in size since CT abdomen   pelvis 06/09/2016 and is favored to reflect small reactive lymph nodes or   treated disease. Recommend attention on follow-up CT or possibly   fluciclovine (  Axumin) PET/CT.  2. ???No thoracic adenopathy.  3. ???Widespread osteosclerotic metastatic disease, some of which   demonstrate low level increased FDG uptake.  4. ???Nonobstructing right renal calculus and redemonstration of multiple   layering calcifications dependently or along the posterior bladder wall   favored to reflect bladder calculi. However a partially calcified   posterior bladder wall mass cannot be excluded. Clinical correlation   recommended.  5. ???If further evaluation of the patient's known prostate carcinoma is   clinically indicated, correlation with fluciclovine (Axumin) PET/CT is   recommended.    Path from 10/05/16:  Bladder tumor:  Involved by prostatic adenocarcinoma    PET scan from November 10, 2016:  Retroperitoneal soft tissue thickening lightly lymphadenopathy is similar to prior in size with a peak SUV of 4.0.  Mild interval increase in widespread osseous metastatic disease.    CT abdomen pelvis with contrast from November 19, 2016 diffuse osseous sclerotic metastases.  No evidence of lymphadenopathy or obvious mass     Review of Systems   Constitutional: Positive for chills and fatigue.   HENT: Positive for drooling and tinnitus.    Eyes: Positive for photophobia. Negative for itching.   Respiratory: Positive for cough.    Gastrointestinal: Positive for abdominal pain, constipation and diarrhea.   Genitourinary: Positive for difficulty urinating and dysuria.   Musculoskeletal: Positive for back pain, gait problem, neck pain and neck stiffness.   Skin: Positive for rash.   Neurological: Positive for dizziness, weakness and numbness.   Psychiatric/Behavioral:        PTST         Objective:         ??? ciclopirox (LOPROX) 0.77 % topical cream Apply  to affected area twice daily.   ??? colesevelam(+) (WELCHOL) 625 mg tablet Take 3 Tabs by mouth twice daily with meals.   ??? dicyclomine (BENTYL) 10 mg capsule Take 10 mg by mouth four times daily.   ??? finasteride (PROSCAR) 5 mg tablet Take 5 mg by mouth daily.   ??? fluocinonide (LIDEX) 0.05 % topical cream Apply to affected area as needed.   ??? Hydrocortisone-Pramoxine (PRAMOSONE) 2.5-1 % oint Apply  to affected area as Needed (apply sparingly to rectum).   ??? ketotifen(+) (ZADITOR) 0.025 % (0.035 %) ophthalmic solution Apply 1 Drop to both eyes twice daily.   ??? LACTOBACILLUS ACIDOPHILUS (ACIDOPHILUS PO) Take  by mouth.   ??? morphine IR (MS-IR) 15 mg tablet Take 1 tablet by mouth every 3 hours as needed for Pain (for pain)   ??? morphine SR (MS CONTIN) 15 mg tablet Take 1 tablet by mouth every 12 hours   ??? morphine SR (MS CONTIN; ORAMORPH SR) 30 mg ER tablet Take 1 tablet by mouth every 12 hours   ??? ondansetron (ZOFRAN) 8 mg tablet Take 1 tablet by mouth every 8 hours as needed for Nausea or Vomiting.   ??? Sulfacetamide Sodium-Sulfur 10-5 % (w/v) lotn Apply  topically to affected area.   ??? zolpidem (AMBIEN) 10 mg tablet Take 10 mg by mouth at bedtime as needed for Sleep.     There were no vitals filed for this visit. There is no height or weight on file to calculate BMI.               Pain Addressed:  N/A    Patient Evaluated for a Clinical Trial: No treatment clinical trial available for this patient.     [place] Cooperative Oncology Group performance status is 0, Fully  active, able to carry on all pre-disease performance without restriction.Marland Kitchen     Physical Exam   Constitutional: He is oriented to person, place, and time. He appears well-developed and well-nourished.   HENT:   Head: Normocephalic and atraumatic.   Eyes: Pupils are equal, round, and reactive to light.   Neck: Neck supple.   Cardiovascular: Normal rate and regular rhythm.    Pulmonary/Chest: Effort normal and breath sounds normal. He has no wheezes.   Abdominal: Soft. Bowel sounds are normal. He exhibits no mass.   Musculoskeletal: He exhibits no edema.   Neurological: He is alert and oriented to person, place, and time. No cranial nerve deficit.   Skin: No rash noted.   Psychiatric: He has a normal mood and affect.   Patient walks with a cane  Swelling over his legs, back, and flank but improved and not as firm          Assessment and Plan:  1.  Metastatic prostate cancer (now with bladder involvement from path from 10/05/16)- He had started a 30 day course of casodex (started 02/26/16) and began therapy with Lupron on March 10, 2016.  He was instructed to start calcium vitamin D..  Patient started docetaxel chemotherapy on 03/16/16.  The plan was for a total of 6 cycles of docetaxel therapy. CT abdomen and pelvis from 06/09/16 as above. I was reluctant to start zometa secondary to dental issues but patient has seen his dentist so I started zometa (first dose 09/29/16).I will stop the zometa secondary to intense muscloskeletal pain and swelling. Due to intense swelling, I stopped the docetaxel. PSA from 11/05/16 was 1.03.  PET scan from November 10, 2016 showed increase in widespread osseous metastases. CT abdomen and pelvis from 11/19/16 as above.  I will start Zytiga soon.Patient will need NP teach (scheduled for Friday 12/04/16). I will refer to radiation oncology for his hip pain and back pain.  2. Return to clinic- I will have the patient return to clinic in 4 weeks.    I answered the patient's questions to his satisfaction.  I spent 40 minutes of this visit in which at least 50% of it was face to face consultation with the patient.

## 2016-12-01 NOTE — Progress Notes
patient did not get treated, Provider plans on changing his treatment

## 2016-12-02 ENCOUNTER — Encounter: Admit: 2016-12-02 | Discharge: 2016-12-02 | Payer: MEDICARE

## 2016-12-02 LAB — COMPREHENSIVE METABOLIC PANEL
Lab: 104 MMOL/L (ref 98–110)
Lab: 139 MMOL/L (ref 137–147)
Lab: 19 mg/dL (ref 7–25)
Lab: 28 MMOL/L (ref 21–30)

## 2016-12-02 LAB — TESTOSTERONE,TOTAL: Lab: <10 ng/dL — ABNORMAL LOW (ref 270–1070)

## 2016-12-02 LAB — PROSTATIC SPECIFIC ANTIGEN-PSA: Lab: 1 ng/mL (ref <4.01)

## 2016-12-02 LAB — MAGNESIUM: Lab: 2 mg/dL (ref 1.6–2.6)

## 2016-12-02 NOTE — Progress Notes
RADIATION ON TREATMENT NOTE  Date:  12/01/2016      Jose Hampton     71 y.o. male.     DOB: [birth year]     MRN#:  [id number]    CHIEF COMPLAINT:        Gleason Score 5 + 5 = 10, T2c NxM1, Stage IVProstatic Adenocarcinoma.   Diffuse Bone Metastases.          11/10/16  PET/CT [facility]):   Retroperitoneal soft tissue thickening, likely lymphadenopathy, stable SUV value of 4.0.   Mild interval increase in widespread osseous metastatic disease.    11/19/16  CT Abd / Pelvis [facility]):   Diffuse sclerosis throughout the entire spine, bony pelvis, and ribs.   Consistent with multi focal sclerotic metastatic disease.    PREVIOUS PROCEDURES / TREATMENTS:  02/26/16 Initiated 30 Day Course of Casodex [facility]).  03/02/16 TRUS with prostate Biopsies [place]).  03/10/16 Initiated Lupron.  04/24/16  Completed L1 - Left Proximal Femur Radiation to 2000 cGy.  04/24/16  Completed Right Proximal Femur Radiation to 2000 cGy.  06/09/16  Completed Taxotere x 5 Mickie Hillier).  09/29/16  Initiated Chesley Mires Mickie Hillier).  12/01/16  Initiated Fabio Asa Mickie Hillier).    RADIATION TREATMENT PLAN:   Right Hip Radiation to 2000 cGy as Outlined Below.    RADIATION TREATMENT SUMMARY   SITE TREATED Method Number of  Treatments Dose per  Fraction Total  Dose    ---  --- ---  --- 400 cGy 2000 cGy   Total Radiation Dose 0 of 5 Radiation Treatments Completed to Date.   Radiation Initiated  Radiation Completed ---  ---   (Anticipated Completion Date)     SUBJECTIVE:  ---     OBJECTIVE:   ---     ASSESSMENT / PLAN:        Bone Mets   Continue radiation therapy as planned.          Jose Musca, MD

## 2016-12-02 NOTE — Progress Notes
The Prior Authorization for Jose Hampton was approved for [name] from 12/02/16 to 05/17/17.  The copay is $17.    [name] has stated this copay is affordable.    The medication will be delivered to the patient within two business days per the patient's request.    [place] Patient Advocate

## 2016-12-02 NOTE — Progress Notes
The Prior Authorization for Fabio Asa was submitted for Kandis Cocking via [facility].  Will continue to follow.    [place] Patient Advocate

## 2016-12-03 ENCOUNTER — Encounter: Admit: 2016-12-03 | Discharge: 2016-12-03 | Payer: MEDICARE

## 2016-12-04 ENCOUNTER — Encounter: Admit: 2016-12-04 | Discharge: 2016-12-04 | Payer: MEDICARE

## 2016-12-04 DIAGNOSIS — K319 Disease of stomach and duodenum, unspecified: ICD-10-CM

## 2016-12-04 DIAGNOSIS — C61 Malignant neoplasm of prostate: Principal | ICD-10-CM

## 2016-12-04 DIAGNOSIS — Z87442 Personal history of urinary calculi: Principal | ICD-10-CM

## 2016-12-04 DIAGNOSIS — M549 Dorsalgia, unspecified: ICD-10-CM

## 2016-12-04 DIAGNOSIS — F4310 Post-traumatic stress disorder, unspecified: ICD-10-CM

## 2016-12-04 DIAGNOSIS — M1990 Unspecified osteoarthritis, unspecified site: ICD-10-CM

## 2016-12-04 DIAGNOSIS — C7951 Secondary malignant neoplasm of bone: ICD-10-CM

## 2016-12-04 DIAGNOSIS — H9190 Unspecified hearing loss, unspecified ear: ICD-10-CM

## 2016-12-04 NOTE — Assessment & Plan Note
Secondary to known bone metastasis.  He notes pain mainly in his hip and back.  He is on MS Contin as well as short acting morphine on an as-needed basis.  He is to see Dr. Grandville Silos soon regarding potential palliative radiation.

## 2016-12-04 NOTE — Assessment & Plan Note
He does have known widespread bony metastasis.  He did initiate some Zometa in May of this year but unfortunately did not tolerate it and it was stopped by Dr. Mickie Hillier.  He is getting ready to see Dr. Grandville Silos regarding potential palliative radiation.

## 2016-12-04 NOTE — Progress Notes
Name: Jose Hampton          MRN: [id number]      DOB: August 19, 1945      AGE: 71 y.o.   DATE OF SERVICE: 12/04/2016    Subjective:             Reason for Visit:  Heme/Onc Care      Jose Hampton is a 71 y.o. male.       History of Present Illness  Jose Hampton is a patient of Dr. Lowella Dell who presents today for detailed education regarding initiating treatment for metastatic prostate cancer. The planned treatment is Zytiga 1000 mg daily as well as prednisone 5 mg twice daily. he is accompanied in the clinic today by his wife.      Cancer history:  ??? 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  ??? Diffuse Bone Metastases.    11/10/16  PET/CT [facility]):  ??? Retroperitoneal soft tissue thickening, likely lymphadenopathy, stable SUV value of 4.0.  ??? Mild interval increase in widespread osseous metastatic disease.  ???  11/19/16  CT Abd / Pelvis [facility]):  ??? Diffuse sclerosis throughout the entire spine, bony pelvis, and ribs.  ??? Consistent with multi focal sclerotic metastatic disease.  ???  PREVIOUS PROCEDURES / TREATMENTS:  02/26/16 ???Initiated 30 Day Course of Casodex Nedra Hai).  03/02/16 ???TRUS with prostate Biopsies [place] [facility]).  03/10/16 ???Initiated Lupron.  04/24/16 ???Completed L1 - Left Proximal Femur Radiation to 2000 cGy.  04/24/16 ???Completed Right Proximal Femur Radiation to 2000 cGy.  06/09/16  Completed Taxotere x 5 Lowella Dell).  09/29/16  Initiated Trinidad Curet Lowella Dell).  12/04/16  Initiated Zytiga and Prednisone Lowella Dell).  ???     Review of Systems   Constitutional: Positive for fatigue. Negative for appetite change, chills and fever.   HENT: Negative.  Negative for mouth sores, sore throat and trouble swallowing.    Eyes: Negative.  Negative for visual disturbance.   Respiratory: Negative.  Negative for cough and shortness of breath.    Cardiovascular: Negative.  Negative for chest pain, palpitations and leg swelling.   Gastrointestinal: Positive for constipation and diarrhea. Negative for abdominal pain, nausea and vomiting.   Endocrine: Negative.    Genitourinary: Negative.  Negative for difficulty urinating.   Musculoskeletal: Positive for back pain and neck pain.        Hip pain   Skin: Negative.    Neurological: Negative.  Negative for dizziness and numbness.   Hematological: Negative for adenopathy. Does not bruise/bleed easily.   Psychiatric/Behavioral: Positive for sleep disturbance (chronic).         Objective:         ??? abiraterone (ZYTIGA) 500 mg tablet Take 2 tablets by mouth daily. Take on an empty stomach, at least 1 hour before or 2 hours after food.   ??? ciclopirox (LOPROX) 0.77 % topical cream Apply  to affected area twice daily.   ??? colesevelam(+) (WELCHOL) 625 mg tablet Take 3 Tabs by mouth twice daily with meals.   ??? dicyclomine (BENTYL) 10 mg capsule Take 10 mg by mouth four times daily.   ??? finasteride (PROSCAR) 5 mg tablet Take 5 mg by mouth daily.   ??? fluocinonide (LIDEX) 0.05 % topical cream Apply to affected area as needed.   ??? furosemide (LASIX) 40 mg tablet Take 1 tablet by mouth every morning.   ??? Hydrocortisone-Pramoxine (PRAMOSONE) 2.5-1 % oint Apply  to affected area as Needed (apply sparingly to rectum).   ???  ketotifen(+) (ZADITOR) 0.025 % (0.035 %) ophthalmic solution Apply 1 Drop to both eyes twice daily.   ??? LACTOBACILLUS ACIDOPHILUS (ACIDOPHILUS PO) Take  by mouth.   ??? morphine IR (MS-IR) 15 mg tablet Take 1 tablet by mouth every 3 hours as needed for Pain (for pain)   ??? morphine SR (MS CONTIN) 15 mg tablet Take 1 tablet by mouth every 12 hours   ??? morphine SR (MS CONTIN; ORAMORPH SR) 30 mg ER tablet Take 1 tablet by mouth every 12 hours   ??? ondansetron (ZOFRAN) 8 mg tablet Take 1 tablet by mouth every 8 hours as needed for Nausea or Vomiting.   ??? prednisone (DELTASONE) 5 mg tablet Take 1 tablet by mouth twice daily with meals.   ??? Sulfacetamide Sodium-Sulfur 10-5 % (w/v) lotn Apply  topically to affected area. ??? zolpidem (AMBIEN) 10 mg tablet Take 10 mg by mouth at bedtime as needed for Sleep.     Vitals:    12/04/16 1239   BP: 114/60   Pulse: 76   Resp: 18   Temp: 36.7 ???C (98.1 ???F)   TempSrc: Oral   SpO2: 96%   Weight: 99.2 kg (218 lb 12.8 oz)   Height: 175.8 cm (69.2)     Body mass index is 32.12 kg/m???.     Pain Score: Ten  Pain Loc: Hip      Pain Addressed:  Current regimen working to control pain.    Patient Evaluated for a Clinical Trial: No treatment clinical trial available for this patient.     [place] Cooperative Oncology Group performance status is 1, Restricted in physically strenuous activity but ambulatory and able to carry out work of a light or sedentary nature, e.g., light house work, office work.     Physical Exam   Constitutional: He is oriented to person, place, and time. He appears well-developed and well-nourished. No distress.   HENT:   Head: Normocephalic and atraumatic.   Nose: Nose normal.   Eyes: Right eye exhibits no discharge. Left eye exhibits no discharge.   Neck: Normal range of motion. Neck supple.   Pulmonary/Chest: Effort normal. No respiratory distress. He has no wheezes.   Abdominal: Soft.   Musculoskeletal: Normal range of motion. He exhibits no edema.   Neurological: He is alert and oriented to person, place, and time.   Skin: Skin is warm and dry.   Psychiatric: He has a normal mood and affect. His behavior is normal. Judgment and thought content normal.        Results for Jose Hampton, Jose Hampton (MRN [id number]) as of 12/04/2016 12:44   Ref. Range 12/01/2016 12:14 12/01/2016 12:41   Hemoglobin Latest Ref Range: 13.5 - 16.5 GM/DL  84.1 (L)   Hematocrit Latest Ref Range: 40 - 50 %  34.5 (L)   Platelet Count Latest Ref Range: 150 - 400 K/UL  290   White Blood Cells Latest Ref Range: 4.5 - 11.0 K/UL  5.4   Neutrophils Latest Ref Range: 41 - 77 %  62   Absolute Neutrophil Count Latest Ref Range: 1.8 - 7.0 K/UL  3.30   Lymphocytes Latest Ref Range: 24 - 44 %  21 (L) Absolute Lymph Count Latest Ref Range: 1.0 - 4.8 K/UL  1.10   Monocytes Latest Ref Range: 4 - 12 %  12   Absolute Monocyte Count Latest Ref Range: 0 - 0.80 K/UL  0.70   Eosinophils Latest Ref Range: 0 - 5 %  5   Absolute Eosinophil  Count Latest Ref Range: 0 - 0.45 K/UL  0.30   Absolute Basophil Count Latest Ref Range: 0 - 0.20 K/UL  0.00   Basophils Latest Ref Range: 0 - 2 %  0   RBC Latest Ref Range: 4.4 - 5.5 M/UL  4.00 (L)   MCV Latest Ref Range: 80 - 100 FL  86.3   MCH Latest Ref Range: 26 - 34 PG  29.6   MCHC Latest Ref Range: 32.0 - 36.0 G/DL  04.5   MPV Latest Ref Range: 7 - 11 FL  7.2   RDW Latest Ref Range: 11 - 15 %  15.3 (H)   Sodium Latest Ref Range: 137 - 147 MMOL/L 139    Potassium Latest Ref Range: 3.5 - 5.1 MMOL/L 4.1    Chloride Latest Ref Range: 98 - 110 MMOL/L 104    CO2 Latest Ref Range: 21 - 30 MMOL/L 28    Anion Gap Latest Ref Range: 3 - 12  7    Blood Urea Nitrogen Latest Ref Range: 7 - 25 MG/DL 19    Creatinine Latest Ref Range: 0.4 - 1.24 MG/DL 4.09    eGFR Non African American Latest Ref Range: >60 mL/min >60    eGFR African American Latest Ref Range: >60 mL/min >60    Glucose Latest Ref Range: 70 - 100 MG/DL 89    Albumin Latest Ref Range: 3.5 - 5.0 G/DL 3.7    Calcium Latest Ref Range: 8.5 - 10.6 MG/DL 8.8    Magnesium Latest Ref Range: 1.6 - 2.6 mg/dL 2.0    Total Bilirubin Latest Ref Range: 0.3 - 1.2 MG/DL 0.4    Total Protein Latest Ref Range: 6.0 - 8.0 G/DL 6.1    AST (SGOT) Latest Ref Range: 7 - 40 U/L 29    ALT (SGPT) Latest Ref Range: 7 - 56 U/L 24    Alk Phosphatase Latest Ref Range: 25 - 110 U/L 281 (H)    Testosterone,Total Latest Ref Range: 270 - 1070 NG/DL  <81 (L)   Prostatic Specific Antigen Latest Ref Range: <4.01 NG/ML  1.03        Assessment and Plan:    Problem   Bone Metastasis (Hcc)   Encounter for Education   Pain   Prostate Cancer Metastatic to Bone (Hcc)       Prostate cancer metastatic to bone [facility]) He has a 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  He most recently completed 5 cycles of Taxotere in January of this year.  Recent PET scan as well as CT scans showed worsening bone disease.  Met with Dr. Lowella Dell earlier this week.  Plan to initiate Zytiga and prednisone.  Education completed on Zytiga and prednisone, see further information below.  He will plan to start these tomorrow, he has received these medications.  He follows up with Dr. Lowella Dell in 1 month with repeat labs.    Bone metastasis (HCC)  He does have known widespread bony metastasis.  He did initiate some Zometa in May of this year but unfortunately did not tolerate it and it was stopped by Dr. Lowella Dell.  He is getting ready to see Dr. Janee Morn regarding potential palliative radiation.    Pain  Secondary to known bone metastasis.  He notes pain mainly in his hip and back.  He is on MS Contin as well as short acting morphine on an as-needed basis.  He is to see Dr. Janee Morn soon regarding potential palliative radiation.  Encounter for education  A thorough pre-assessment and teaching session explaining the mechanism of action, possible side effects, precautions and instructions regarding Zytiga and Prednisone was conducted. Specific side effects and their management discussed included, but are not limited to: Fluid retention, increased triglycerides, increased liver enzymes, joint swelling or discomfort, decreased potassium, muscle aches, decreased phosphorus, hot flashes, diarrhea, urinary tract infections, cough, elevated blood pressure, increased appetite, irritability, trouble sleeping, nausea, heartburn, muscle weakness, impaired wound healing, increased blood sugar levels, headaches, dizziness, mood swings, cataracts and bone thinning.    We discussed that pregnancy must be avoided as well as diagnostic tests and disease process. Finally, we also discussed the importance of self care both emotionally and physically including staying hydrated, maintaining weight, good nutrition with concentration on high protein intake and staying active.     We discussed the timing and schedule for this medicine. Also discussed instructions on how and when to take and what to do if a missed dose. Discussed importance of compliance and adherence as well as possible ways to help with this.     Both verbal and written instructions were provided. All questions were answered to the best of my ability. The patient expressed understanding of what was explained to them, participated and agreed with the present plan. Written informed consent was obtained and witnessed. The patient has received contact information for the clinic and was instructed on how to contact us if questions or concerns arise. They also verbalized understanding of when he should report signs and symptoms to [place].    Total time spent with patient was 40 minutes with 100% of time spent in education and counseling.

## 2016-12-04 NOTE — Progress Notes
Oral Specialty Medication Counseling  Abiraterone (Zytiga???) and Prednisone    Abiel Antrim was provided medication education regarding his new oral hormonal agent.     I reviewed the role of  specialty pharmacy, including access to medication assistance specialists if needed.     How to take the medication:  Christpher Stogsdill was educated on abiraterone (Zytiga???) and prednisone for metastatic prostate cancer; the indication for treatment, dose, route, frequency and duration of therapy were reviewed.     Directions:  Abiraterone (Zytiga???) 1000 mg (two  500mg  tablets) by mouth on an empty stomach, at least 1 hour before and 2 hours after food, once daily until unacceptable toxicity or lack of efficacy.  Take in combination with prednisone 5 mg by mouth twice daily.     Patient was educated to swallow tablets whole and not to crush, chew or open capsules.    How to Store Medication:  D'Arcy Abraha was educated to store abiraterone (Zytiga???) at room temperature in a safe place away from humidity, pets, and children.  I instructed the patient that it was okay to store abiraterone (Zytiga???) in a pill box, if needed.  I recommended if family members would be handling the medication, they should use gloves.  Additionally, I recommended cleaning any surfaces touched by abiraterone (Zytiga???) with bleach, if possible.     Adherence:  Patient was educated on the importance of adherence and that the consequences of non-adherence could include disease progression. The patient's ability to be adherent with drug therapies was discussed and the patient was provided options for tools/resources that promote adherence to therapy. For [name] alarms, calendars, pillboxes, and technology (reminder apps) were recommended and/or provided.     How to Manage Missed Doses:  I instructed the patient that if a dose is missed, he should take it as soon as he remembers on the same day and to return to normally scheduled doses the following day; however, not to take extra tablets to make up for the missed dose.  If tablets are vomited up, I recommended not taking additional doses that day.  Instead, resume the medication at the next scheduled dose.     Contraindications / Safety Precautions / Adverse Effects:  Contraindications to therapy, safety precautions, and common adverse effects (listed below) were discussed with the patient.  I explained that most patients do NOT experience all these side effects and that this list was not inclusive.  I instructed patient to report any adverse effects to their doctor, pharmacist or nurse.   ? Mineralocorticoid excess (hypertension, hypokalemia, fluid retention)  ? Fatigue or insomnia  ? Excessive bruising  ? Hypertriglyceridemia, hyperglycemia  ? Hot flashes  ? Constipation, diarrhea, or dyspepsia  ? Myalgia  ? Transaminitis    REMS Program:  No REMS is required for this medication.    Drug-Drug Interactions:  A medication history and reconciliation was performed (including prescription medications, supplements, over the counter medications, and herbal products). The medication list was updated and the patient???s current medication list is included below.  I stressed the importance of maintaining an accurate medication list and informing their medical team prior to taking any new medications.       Home Medications    Medication Sig   abiraterone (ZYTIGA) 500 mg tablet Take 2 tablets by mouth daily. Take on an empty stomach, at least 1 hour before or 2 hours after food.   ciclopirox (LOPROX) 0.77 % topical cream Apply  to  affected area twice daily.   colesevelam(+) (WELCHOL) 625 mg tablet Take 3 Tabs by mouth twice daily with meals.  Patient taking differently: Take 1,875 mg by mouth twice daily with meals.   dicyclomine (BENTYL) 10 mg capsule Take 10 mg by mouth four times daily.   finasteride (PROSCAR) 5 mg tablet Take 5 mg by mouth daily. fluocinonide (LIDEX) 0.05 % topical cream Apply to affected area as needed.   furosemide (LASIX) 40 mg tablet Take 1 tablet by mouth every morning.  Patient taking differently: Take 40 mg by mouth every morning.   Hydrocortisone-Pramoxine (PRAMOSONE) 2.5-1 % oint Apply  to affected area as Needed (apply sparingly to rectum).   ibuprofen (MOTRIN) 400 mg tablet Take 400 mg by mouth every 8 hours as needed for Pain. Take with food.   ketotifen(+) (ZADITOR) 0.025 % (0.035 %) ophthalmic solution Apply 1 Drop to both eyes twice daily.   LACTOBACILLUS ACIDOPHILUS (ACIDOPHILUS PO) Take  by mouth.   morphine IR (MS-IR) 15 mg tablet Take 1 tablet by mouth every 3 hours as needed for Pain (for pain)   morphine SR (MS CONTIN) 15 mg tablet Take 1 tablet by mouth every 12 hours   morphine SR (MS CONTIN; ORAMORPH SR) 30 mg ER tablet Take 1 tablet by mouth every 12 hours   ondansetron (ZOFRAN) 8 mg tablet Take 1 tablet by mouth every 8 hours as needed for Nausea or Vomiting.   prednisone (DELTASONE) 5 mg tablet Take 1 tablet by mouth twice daily with meals.   Sulfacetamide Sodium-Sulfur 10-5 % (w/v) lotn Apply  topically to affected area.   zolpidem (AMBIEN) 10 mg tablet Take 10 mg by mouth at bedtime as needed for Sleep.       Drug-drug and drug-food interactions with the new therapy were assessed and reviewed with the patient. No significant drug-drug interactions were identified.     Reproductive Concerns:  Reproductive concerns were reviewed with the patient. As patient is a male, education was provided regarding adequate contraception for male partners of reproductive potential and contacting his physician immediately should his partner become pregnant.     What to do with any unused or expired medications:  Ferrel Walser was instructed to return any unused or expired medication to a disposal bin at one of the retail pharmacy locations or to utilize a community drug take back program.  Instructed the patient not to flush the medication down the toilet.     Monitoring:  Monitoring and follow-up plan was discussed with patient. Osualdo Tavis was instructed to contact the oral chemotherapy/specialty medication pharmacist at [phone] if they have any questions or concerns regarding their medication therapy. Informed the patient that we would send the prescription to a specialty pharmacy and that the pharmacy would be calling the patient to schedule a shipment.  Emphasized if their phone calls were not answered, the specialty pharmacy would not ship the medication.    This medication is considered low risk per our internal oral chemotherapy risk categorization and the patient will be contacted for education, toxicity check at 2 weeks, and reassessment annually, if applicable (low risk monitoring).       Patient was given the opportunity to ask questions. Patient verbalized understanding, agreed with the plan and had no questions or concerns regarding therapy.     Franchot Mimes, [facility]  Clinical Pharmacist  12/04/16

## 2016-12-04 NOTE — Assessment & Plan Note
A thorough pre-assessment and teaching session explaining the mechanism of action, possible side effects, precautions and instructions regarding Zytiga and Prednisone was conducted. Specific side effects and their management discussed included, but are not limited to: Fluid retention, increased triglycerides, increased liver enzymes, joint swelling or discomfort, decreased potassium, muscle aches, decreased phosphorus, hot flashes, diarrhea, urinary tract infections, cough, elevated blood pressure, increased appetite, irritability, trouble sleeping, nausea, heartburn, muscle weakness, impaired wound healing, increased blood sugar levels, headaches, dizziness, mood swings, cataracts and bone thinning.    We discussed that pregnancy must be avoided as well as diagnostic tests and disease process. Finally, we also discussed the importance of self care both emotionally and physically including staying hydrated, maintaining weight, good nutrition with concentration on high protein intake and staying active.     We discussed the timing and schedule for this medicine. Also discussed instructions on how and when to take and what to do if a missed dose. Discussed importance of compliance and adherence as well as possible ways to help with this.     Both verbal and written instructions were provided. All questions were answered to the best of my ability. The patient expressed understanding of what was explained to them, participated and agreed with the present plan. Written informed consent was obtained and witnessed. The patient has received contact information for the clinic and was instructed on how to contact us if questions or concerns arise. They also verbalized understanding of when he should report signs and symptoms to [place].    Total time spent with patient was 40 minutes with 100% of time spent in education and counseling.

## 2016-12-10 ENCOUNTER — Encounter: Admit: 2016-12-10 | Discharge: 2016-12-10 | Payer: MEDICARE

## 2016-12-17 ENCOUNTER — Encounter: Admit: 2016-12-17 | Discharge: 2016-12-17 | Payer: MEDICARE

## 2016-12-17 NOTE — Telephone Encounter
Oral Chemotherapy Cycle 1 Toxicity Check    Summary of Therapy/Adherence Assessment    Amith Noblett continues on Zytiga for the treatment of prostate cancer.  Cycle 1 start date was 12/05/16. [name] confirms he is taking the medication as prescribed - 1000mg  (two 500mg  tablets by mouth daily on an empty stomach).     [name] reports missing 0 doses over the past 2 week(s) not related to toxicity. Patient was re-educated on importance of adherence. Mr Barnosky did report it was more difficult to remember the prednisone dose in the morning and he has missed two of his morning prednisone doses.    No renal or hepatic dose adjustment is required at this time.  No dose adjustments based on toxicity are required at this time.     Treatment will continue until progression or unacceptable toxicity.        CBC w/Diff    Lab Results   Component Value Date/Time    WBC 5.4 12/01/2016 12:41 PM    RBC 4.00 (L) 12/01/2016 12:41 PM    HGB 11.9 (L) 12/01/2016 12:41 PM    HCT 34.5 (L) 12/01/2016 12:41 PM    MCV 86.3 12/01/2016 12:41 PM    MCH 29.6 12/01/2016 12:41 PM    MCHC 34.4 12/01/2016 12:41 PM    RDW 15.3 (H) 12/01/2016 12:41 PM    PLTCT 290 12/01/2016 12:41 PM    MPV 7.2 12/01/2016 12:41 PM    Lab Results   Component Value Date/Time    NEUT 62 12/01/2016 12:41 PM    ANC 3.30 12/01/2016 12:41 PM    LYMA 21 (L) 12/01/2016 12:41 PM    ALC 1.10 12/01/2016 12:41 PM    MONA 12 12/01/2016 12:41 PM    AMC 0.70 12/01/2016 12:41 PM    EOSA 5 12/01/2016 12:41 PM    AEC 0.30 12/01/2016 12:41 PM    BASA 0 12/01/2016 12:41 PM    ABC 0.00 12/01/2016 12:41 PM        Comprehensive Metabolic Profile    Lab Results   Component Value Date/Time    NA 139 12/01/2016 12:14 PM    K 4.1 12/01/2016 12:14 PM    CL 104 12/01/2016 12:14 PM    CO2 28 12/01/2016 12:14 PM    GAP 7 12/01/2016 12:14 PM    BUN 19 12/01/2016 12:14 PM    CR 0.95 12/01/2016 12:14 PM    GLU 89 12/01/2016 12:14 PM    Lab Results   Component Value Date/Time CA 8.8 12/01/2016 12:14 PM    ALBUMIN 3.7 12/01/2016 12:14 PM    TOTPROT 6.1 12/01/2016 12:14 PM    ALKPHOS 281 (H) 12/01/2016 12:14 PM    AST 29 12/01/2016 12:14 PM    ALT 24 12/01/2016 12:14 PM    TOTBILI 0.4 12/01/2016 12:14 PM    GFR >60 12/01/2016 12:14 PM    GFRAA >60 12/01/2016 12:14 PM        Serum creatinine: 0.95 mg/dL 16/10/96 0454  Estimated creatinine clearance: 85.5 mL/min      Adverse Effects Assessment    Eniola Joswick is having the following adverse effects: hot flashes.   None of the adverse effects were severe enough to interfere with adherence.     Interaction Check    Mansfield Cosgrove reports the following medication changes since the last medication history during their education visit: none    Drug-drug and drug-food interactions between the patients??? specialty  medication and their medication list were assessed.     No significant drug-drug interactions were identified.     The patient was instructed to speak with their health care provider and/or the oral chemotherapy pharmacist before starting any new drug, including prescription or over the counter, natural / herbal products, or vitamins.     Risk Evaluation and Mitigation Strategy (REMS) Assessment    No REMS is required for this medication.    Followup Plan     The patient was encouraged to call the oral chemotherapy pharmacist at [phone] with questions.   Re-assessment has been completed. Next reassessment planned for 1 year(s).     Franchot Mimes, [facility]  Oncology Clinical Pharmacist  12/17/2016

## 2017-01-01 ENCOUNTER — Encounter: Admit: 2017-01-01 | Discharge: 2017-01-01 | Payer: MEDICARE

## 2017-01-02 NOTE — Progress Notes
RADIATION ONCOLOGY END OF TREATMENT NOTE    Date:  01/01/2017    Jose Hampton     71 y.o. male.     DOB: 29-Sep-1945                    MRN#:  [id number]    CHIEF COMPLAINT:        Gleason Score 5 + 5 = 10, T2c NxM1, Stage IVProstatic Adenocarcinoma.   Diffuse Bone Metastases.          11/10/16  PET/CT [facility]):   Retroperitoneal soft tissue thickening, likely lymphadenopathy, stable SUV value of 4.0.   Mild interval increase in widespread osseous metastatic disease.    11/19/16  CT Abd / Pelvis [facility]):   Diffuse sclerosis throughout the entire spine, bony pelvis, and ribs.   Consistent with multi focal sclerotic metastatic disease.    PREVIOUS PROCEDURES / TREATMENTS:  02/26/16 Initiated 30 Day Course of Casodex Truman Hayward).  03/02/16 TRUS with prostate Biopsies Nyra et).  03/10/16 Initiated Lupron.  04/24/16 Completed L1 - Left Proximal Femur Radiation to 2000 cGy.  04/24/16 Completed Right Proximal Femur Radiation to 2000 cGy.  06/09/16  Completed Taxotere x 5 Mickie Hillier).  09/29/16  Initiated Chesley Mires Mickie Hillier).  12/01/16  Initiated Fabio Asa Mickie Hillier).  12/31/16  Completed Right Hip Radiation to 2000 cGy  [facility]).    RADIATION TREATMENT SUMMARY:     RADIATION TREATMENT SUMMARY   SITE TREATED Method Number of  Treatments Dose per  Fraction Total  Dose   Right Hip 18 MV 5 400 cGy 2000 cGy   Total Radiation Dose 2000 cGy in 5 Radiation Treatments.   Radiation Initiated  Radiation Completed 12/23/16  12/31/16     RADIATION TREATMENT TOXICITY:  Jose Hampton developed the expected toxicity during the radiation treatment course.    FOLLOW UP:     F/U with Dr. Mickie Hillier on 01/05/17.   Jose Hampton is not a candidate for any open radiation protocols.        Patience Musca, MD

## 2017-01-05 ENCOUNTER — Encounter: Admit: 2017-01-05 | Discharge: 2017-01-05 | Payer: MEDICARE

## 2017-01-05 DIAGNOSIS — F4310 Post-traumatic stress disorder, unspecified: ICD-10-CM

## 2017-01-05 DIAGNOSIS — M1990 Unspecified osteoarthritis, unspecified site: ICD-10-CM

## 2017-01-05 DIAGNOSIS — H9190 Unspecified hearing loss, unspecified ear: ICD-10-CM

## 2017-01-05 DIAGNOSIS — M549 Dorsalgia, unspecified: ICD-10-CM

## 2017-01-05 DIAGNOSIS — C61 Malignant neoplasm of prostate: Principal | ICD-10-CM

## 2017-01-05 DIAGNOSIS — Z923 Personal history of irradiation: ICD-10-CM

## 2017-01-05 DIAGNOSIS — C7951 Secondary malignant neoplasm of bone: ICD-10-CM

## 2017-01-05 DIAGNOSIS — G893 Neoplasm related pain (acute) (chronic): ICD-10-CM

## 2017-01-05 DIAGNOSIS — Z87442 Personal history of urinary calculi: Principal | ICD-10-CM

## 2017-01-05 DIAGNOSIS — K319 Disease of stomach and duodenum, unspecified: ICD-10-CM

## 2017-01-05 LAB — CBC AND DIFF
Lab: 0 10*3/uL (ref 0–0.20)
Lab: 4.5 M/UL — ABNORMAL LOW (ref 4.4–5.5)
Lab: 6.3 10*3/uL — ABNORMAL LOW (ref 4.5–11.0)

## 2017-01-05 MED ORDER — MORPHINE 15 MG PO TAB
15 mg | ORAL_TABLET | ORAL | 0 refills | 7.00000 days | Status: AC | PRN
Start: 2017-01-05 — End: 2017-01-14

## 2017-01-05 MED ORDER — MORPHINE 15 MG PO TBER
15 mg | ORAL_TABLET | Freq: Two times a day (BID) | ORAL | 0 refills | 7.00000 days | Status: AC
Start: 2017-01-05 — End: 2017-01-14

## 2017-01-05 MED ORDER — FUROSEMIDE 40 MG PO TAB
20 mg | ORAL_TABLET | Freq: Every morning | ORAL | 3 refills | 90.00000 days | Status: AC
Start: 2017-01-05 — End: 2017-08-05

## 2017-01-05 MED ORDER — MORPHINE 30 MG PO TBER
30 mg | ORAL_TABLET | Freq: Two times a day (BID) | ORAL | 0 refills | 7.00000 days | Status: AC
Start: 2017-01-05 — End: 2017-01-14

## 2017-01-05 NOTE — Progress Notes
Name: Jose Hampton          MRN: [id number]      DOB: 08/11/1945      AGE: 71 y.o.   DATE OF SERVICE: 01/05/2017    Subjective:             Reason for Visit:   Heme/Onc Care      Jose Hampton is a 71 y.o. male who presents to my oncology clinic for follow up for his metastatic prostate cancer. Patient admits to hot flashes from the Zytiga    History of Present Illness  Oncological history:  He has long-standing chronic pain for which was previously followed and a pain clinic and had been receiving injections for his back, shoulder and leg pain.  He was a transfer care to pain clinic in [facility] and so had an MRI of the lumbar spine performed which showed a number lesions in the lumbar vertebral bodies suspicious for sclerotic metastatic lesions.  He further had this worked up with a bone scan which showed extensive metastatic disease involving the thoracic and lumbar spine, ribs, pelvis proximal Femara and right humerus.  Patient had not had a prostate biopsy performed and noted to urology that his last rectal exam was approximately 3 years ago.  Patient stated that he had surgery to his rectum to urology to make him have loose stools for multiple days after having a rectal exam done.    Per urology's notes his PSA was 9.4.    An MRI of the cervical spine from January 13, 2016:  Spondylolysis C4 through C6.  Straightening of the lordotic curvature and evidence of subtle retrolisthesis of C4 upon C5.  C4-C5 severe degenerative disc disease with central osteophyte or disc complex.  C6-C7 mild degenerative disc disease.    MRI of the lumbar spine from January 13, 2016:  There are multiple rounded lesions in the lumbar vertebral bodies with decreased T1-T2 signal suspicious for sclerotic metastatic lesions.  Multilevel degenerative disc disease with varying degrees of spinal stenosis.    A bone scan from January 30, 2016:  Findings consistent with extensive metastatic disease involving the thoracic and lumbar spine, ribs, pelvis and proximal Femara and right humerus.  Activity in several joints most likely arthritic    Pathology from March 03, 2016:  Prostate left apex biopsy: Prostatic adenocarcinoma Gleason grade 5+5 equals score 10 and 1 core involving 80% of needle core tissue measuring 7 mm in length.  Perineural invasion present.  Prostate left mid biopsy:  Prostatic adenocarcinoma Gleason grade 5+5 equals score 10 and 1 core involving 80% and needle core of tissue measuring 7 mm in length.  Prostate left base biopsy:  Prostatic adenocarcinoma Gleason grade 5+5 equals score 10 and 1 core involving 60% the needle core tissue measuring 7 mm in length.  Perineural invasion present.  Prostate left lateral apex biopsy:  Prostatic adenocarcinoma Gleason grade 5+5 equals were 10 and 1 core involving 95% and needle core tissue, measuring 10 mm in length.  Prostate left lateral mid biopsy:  Prostatic adenocarcinoma Gleason grade 5+5 equals were 10 and 1 core, involving 60% and needle core tissue, measuring 6 mm in length.  Perineural invasion present.  Prostate left lateral base biopsy:  Prostatic adenocarcinoma Gleason grade 5+5 score 10 and 1 core involving 90% needle core tissue, measuring 10 mm in length.  Prostate right apex biopsy:  Prostatic adenocarcinoma Gleason grade 5+5 and glucose 410 and 1 core involving 100% and needle  core tissue, measuring 11 mm in length.  Prostate right mid biopsy:  Prostatic adenocarcinoma Gleason grade 5+5 he was scored 10 and 1 core involving 80% the needle core tissue, measuring 8 mm in length.  Prostate right base biopsy:  Prostatic adenocarcinoma Gleason grade 5+5 score 10 and 1 core involving 90% and needle core tissue measuring 12 mm in length.  Prostate right lateral apex biopsy:  Prostatic adenocarcinoma Gleason grade 5+5 equals were 10 and 1 core involving 80% of the needle core tissue measuring 9 mm in length. Prostate right lateral mid biopsy: Prostatic adenocarcinoma Gleason grade 5+5 score 10 and 1 core involving 75% and needle core tissue, measuring 9 mm in length.  Prostate right lateral base biopsy: Prostatic adenocarcinoma Gleason grade 5+5 equals were 10 and 1 core involving 80% and needle core tissue, measuring 9 mm in length.    He has started a 30 day course of casodex (02/26/16) and is scheduled to begin therapy with Lupron on March 10, 2016.  He was instructed to start calcium vitamin D to total 1200 mg of calcium 810,000 international units of vitamin D daily in divided doses.  Patient is also to start docetaxel chemotherapy.  The plan is for a total of 6 cycles of docetaxel therapy.    Started docetaxel on 03/16/16      05/03/16-12/19: Admitted to [facility] for bloody diarrhea. ???He was found to have colitis, presumed to be due to treatment vs. Infection. ???He was placed on Levaquin and discharged on 12/19.???He has a hx of IBS-D.???  ???  05/16/16: Went to the ED at [facility]???for BLE swelling. ???Bilateral doppler was negative for VTE. ???He was sent home and has been using lasix for a few days for swelling.  ???  RADIATION TREATMENT SUMMARY   SITE TREATED Method Number of  Treatments Dose per  Fraction Total  Dose   L1 Through L Prox Femur  Right Proximal Femur AP-PA 18X  AP-PA 18X 5  5 400 cGy  400 cGy 2000 cGy  2000 cGy   Total Radiation Dose 2000 cGy in 5 Radiation Treatments.   Radiation Initiated  Radiation Completed 04/20/16  04/24/16      I lowered the dose of docetaxel to 60 mg/m2 secondary to leg swelling      CT of Abdomen/Pelvis 06/09/16  IMPRESSION    1. Widespread sclerotic osseous metastatic disease.  2. Normal size prostate with no abdominopelvic lymphadenopathy.  3. 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  4. Nonobstructing right renal calculus.  5. Multiple calcifications noted along the posterior wall the urinary bladder which is incompletely distended. These likely represent multiple   small layering bladder calculi. However, partial calcified posterior   bladder wall mass cannot be excluded. Clinical correlation is recommended.   Correlation with bladder ultrasound is recommended if clinically   indicated.  6. Mild colonic diverticulosis and small hiatal hernia.    Echo from 06/17/16:  Interpretation Summary     Left ventricular systolic function is within normal limits.  LVEF 60%  No significant valvular abnormalities.  No pericardial effusion.   Normal diastolic function.     PET scan from 07/10/16:  IMPRESSION    ???  1. ???Low level FDG uptake within minimal retroperitoneal soft tissue   thickening, which is not significantly changed in size since CT abdomen   pelvis 06/09/2016 and is favored to reflect small reactive lymph nodes or   treated disease. Recommend attention  on follow-up CT or possibly   fluciclovine (Axumin) PET/CT.  2. ???No thoracic adenopathy.  3. ???Widespread osteosclerotic metastatic disease, some of which   demonstrate low level increased FDG uptake.  4. ???Nonobstructing right renal calculus and redemonstration of multiple   layering calcifications dependently or along the posterior bladder wall   favored to reflect bladder calculi. However a partially calcified   posterior bladder wall mass cannot be excluded. Clinical correlation   recommended.  5. ???If further evaluation of the patient's known prostate carcinoma is   clinically indicated, correlation with fluciclovine (Axumin) PET/CT is   recommended.    Path from 10/05/16:  Bladder tumor:  Involved by prostatic adenocarcinoma    PET scan from November 10, 2016:  Retroperitoneal soft tissue thickening lightly lymphadenopathy is similar to prior in size with a peak SUV of 4.0.  Mild interval increase in widespread osseous metastatic disease.    CT abdomen pelvis with contrast from November 19, 2016 diffuse osseous sclerotic metastases.  No evidence of lymphadenopathy or obvious mass    12/04/16 ???Initiated???Zytiga and Prednisone    RADIATION TREATMENT SUMMARY   SITE TREATED Method Number of  Treatments Dose per  Fraction Total  Dose   Right Hip 18 MV 5 400 cGy 2000 cGy   Total Radiation Dose 2000 cGy in 5 Radiation Treatments.   Radiation Initiated  Radiation Completed 12/23/16  12/31/16     Hospital Outpatient Visit on 01/05/2017   Component Date Value Ref Range Status   ??? White Blood Cells 01/05/2017 6.3  4.5 - 11.0 K/UL Final   ??? RBC 01/05/2017 4.52  4.4 - 5.5 M/UL Final   ??? Hemoglobin 01/05/2017 13.8  13.5 - 16.5 GM/DL Final   ??? Hematocrit 01/05/2017 40.6  40 - 50 % Final   ??? MCV 01/05/2017 89.8  80 - 100 FL Final   ??? MCH 01/05/2017 30.5  26 - 34 PG Final   ??? MCHC 01/05/2017 33.9  32.0 - 36.0 G/DL Final   ??? RDW 54/01/8118 15.0  11 - 15 % Final   ??? Platelet Count 01/05/2017 285  150 - 400 K/UL Final   ??? MPV 01/05/2017 7.7  7 - 11 FL Final   ??? Neutrophils 01/05/2017 73  41 - 77 % Final   ??? Lymphocytes 01/05/2017 16* 24 - 44 % Final   ??? Monocytes 01/05/2017 9  4 - 12 % Final   ??? Eosinophils 01/05/2017 2  0 - 5 % Final   ??? Basophils 01/05/2017 0  0 - 2 % Final   ??? Absolute Neutrophil Count 01/05/2017 4.60  1.8 - 7.0 K/UL Final   ??? Absolute Lymph Count 01/05/2017 1.00  1.0 - 4.8 K/UL Final   ??? Absolute Monocyte Count 01/05/2017 0.60  0 - 0.80 K/UL Final   ??? Absolute Eosinophil Count 01/05/2017 0.10  0 - 0.45 K/UL Final   ??? Absolute Basophil Count 01/05/2017 0.00  0 - 0.20 K/UL Final          Review of Systems   Constitutional: Positive for activity change, appetite change and diaphoresis.   HENT: Positive for drooling and postnasal drip.    Eyes: Positive for photophobia. Negative for itching.   Respiratory: Negative for cough.    Cardiovascular: Positive for chest pain (Patient states it is not cardiac).   Gastrointestinal: Positive for constipation and diarrhea.   Genitourinary: Positive for difficulty urinating. Musculoskeletal: Positive for back pain, gait problem, neck pain and neck stiffness.   Skin:  Negative for rash.   Neurological: Positive for weakness.   Psychiatric/Behavioral:        PTSD         Objective:         ??? abiraterone (ZYTIGA) 500 mg tablet Take 2 tablets by mouth daily. Take on an empty stomach, at least 1 hour before or 2 hours after food.   ??? ciclopirox (LOPROX) 0.77 % topical cream Apply  to affected area twice daily.   ??? colesevelam(+) (WELCHOL) 625 mg tablet Take 3 Tabs by mouth twice daily with meals. (Patient taking differently: Take 1,875 mg by mouth twice daily with meals.)   ??? dicyclomine (BENTYL) 10 mg capsule Take 10 mg by mouth four times daily.   ??? finasteride (PROSCAR) 5 mg tablet Take 5 mg by mouth daily.   ??? fluocinonide (LIDEX) 0.05 % topical cream Apply to affected area as needed.   ??? furosemide (LASIX) 40 mg tablet Take 1 tablet by mouth every morning. (Patient taking differently: Take 40 mg by mouth every morning.)   ??? Hydrocortisone-Pramoxine (PRAMOSONE) 2.5-1 % oint Apply  to affected area as Needed (apply sparingly to rectum).   ??? ibuprofen (MOTRIN) 400 mg tablet Take 400 mg by mouth every 8 hours as needed for Pain. Take with food.   ??? ketotifen(+) (ZADITOR) 0.025 % (0.035 %) ophthalmic solution Apply 1 Drop to both eyes twice daily.   ??? LACTOBACILLUS ACIDOPHILUS (ACIDOPHILUS PO) Take  by mouth.   ??? morphine IR (MS-IR) 15 mg tablet Take 1 tablet by mouth every 3 hours as needed for Pain (for pain)   ??? morphine SR (MS CONTIN) 15 mg tablet Take 1 tablet by mouth every 12 hours   ??? morphine SR (MS CONTIN; ORAMORPH SR) 30 mg ER tablet Take 1 tablet by mouth every 12 hours   ??? ondansetron (ZOFRAN) 8 mg tablet Take 1 tablet by mouth every 8 hours as needed for Nausea or Vomiting.   ??? prednisone (DELTASONE) 5 mg tablet Take 1 tablet by mouth twice daily with meals.   ??? Sulfacetamide Sodium-Sulfur 10-5 % (w/v) lotn Apply  topically to affected area. ??? zolpidem (AMBIEN) 10 mg tablet Take 10 mg by mouth at bedtime as needed for Sleep.     Vitals:    01/05/17 1304   BP: 104/60   Pulse: 80   Resp: 18   Temp: 36.9 ???C (98.4 ???F)   TempSrc: Oral   SpO2: 92%   Weight: 98.8 kg (217 lb 12.8 oz)   Height: 175.8 cm (69.2)     Body mass index is 31.98 kg/m???.     Pain Score: Ten  Pain Loc: Hip (right - post radiation)      Pain Addressed:  N/A    Patient Evaluated for a Clinical Trial: No treatment clinical trial available for this patient.     [place] Cooperative Oncology Group performance status is 0, Fully active, able to carry on all pre-disease performance without restriction.Marland Kitchen     Physical Exam   Constitutional: He is oriented to person, place, and time. He appears well-developed and well-nourished.   HENT:   Head: Normocephalic and atraumatic.   Eyes: Pupils are equal, round, and reactive to light.   Neck: Neck supple.   Cardiovascular: Normal rate and regular rhythm.    Pulmonary/Chest: Effort normal and breath sounds normal. He has no wheezes.   Abdominal: Soft. Bowel sounds are normal. He exhibits no mass.   Musculoskeletal: He exhibits no edema.  Neurological: He is alert and oriented to person, place, and time. No cranial nerve deficit.   Skin: No rash noted.   Psychiatric: He has a normal mood and affect.   Patient walks with a cane  Swelling over his legs, back, and flank but improved           Assessment and Plan:  1.  Metastatic prostate cancer (now with bladder involvement from path from 10/05/16)- He had started a 30 day course of casodex (started 02/26/16) and began therapy with Lupron on March 10, 2016.  He was instructed to start calcium vitamin D..  Patient started docetaxel chemotherapy on 03/16/16.  The plan was for a total of 6 cycles of docetaxel therapy. CT abdomen and pelvis from 06/09/16 as above. I was reluctant to start zometa secondary to dental issues but patient has seen his dentist so I started zometa (first dose 09/29/16).I will stop the zometa secondary to intense muscloskeletal pain and swelling. Due to intense swelling, I stopped the docetaxel. PET scan from November 10, 2016 showed increase in widespread osseous metastases. CT abdomen and pelvis from 11/19/16 as above.  I started Zytiga on 12/04/16..Patient started radiation on 12/23/16 to the right hip for pain control andhe completed the radiation on 12/31/16. I would check scans on 02/04/17.  2. Return to clinic- I will have the patient return to clinic on a prn basis as he is transitioning to Dr. Katy Fitch at the [facility]. I wished Mr. Cassey well. If the need arises, I will be more than happy to see Mr. Diallo in the future.    I answered the patient's questions to his satisfaction.  I spent 40 minutes of this visit in which at least 50% of it was face to face consultation with the patient.

## 2017-01-06 LAB — COMPREHENSIVE METABOLIC PANEL
Lab: 1 mg/dL (ref 0.4–1.24)
Lab: 109 mg/dL — ABNORMAL HIGH (ref 70–100)
Lab: 138 MMOL/L (ref 137–147)
Lab: 14 U/L (ref 7–56)
Lab: 18 U/L (ref 7–40)
Lab: 3.9 g/dL — ABNORMAL LOW (ref 3.5–5.0)
Lab: 30 MMOL/L (ref 21–30)
Lab: 302 U/L — ABNORMAL HIGH (ref 25–110)
Lab: 4 MMOL/L (ref 3.5–5.1)
Lab: 5 K/UL (ref 3–12)
Lab: 6.7 g/dL — ABNORMAL HIGH (ref 6.0–8.0)
Lab: >60 mL/min (ref >60)
Lab: >60 mL/min (ref >60)

## 2017-01-06 LAB — PROSTATIC SPECIFIC ANTIGEN-PSA: Lab: 0.1 ng/mL (ref <4.01)

## 2017-01-06 LAB — TESTOSTERONE,TOTAL: Lab: <10 ng/dL — ABNORMAL LOW (ref 270–1070)

## 2017-01-14 ENCOUNTER — Encounter: Admit: 2017-01-14 | Discharge: 2017-01-14 | Payer: MEDICARE

## 2017-01-14 DIAGNOSIS — G893 Neoplasm related pain (acute) (chronic): Principal | ICD-10-CM

## 2017-01-14 MED ORDER — MORPHINE 30 MG PO TBER
30 mg | ORAL_TABLET | Freq: Two times a day (BID) | ORAL | 0 refills | 7.00000 days | Status: AC
Start: 2017-01-14 — End: 2017-07-13

## 2017-01-14 MED ORDER — MORPHINE 15 MG PO TBER
15 mg | ORAL_TABLET | Freq: Two times a day (BID) | ORAL | 0 refills | 7.00000 days | Status: AC
Start: 2017-01-14 — End: 2017-07-13

## 2017-01-14 MED ORDER — MORPHINE 15 MG PO TAB
15 mg | ORAL_TABLET | ORAL | 0 refills | 7.00000 days | Status: AC | PRN
Start: 2017-01-14 — End: 2017-01-14

## 2017-01-14 MED ORDER — MORPHINE 15 MG PO TAB
15 mg | ORAL_TABLET | ORAL | 0 refills | 7.00000 days | Status: AC | PRN
Start: 2017-01-14 — End: 2017-07-13

## 2017-04-27 ENCOUNTER — Encounter: Admit: 2017-04-27 | Discharge: 2017-04-27 | Payer: MEDICARE

## 2017-04-28 MED ORDER — FLUOCINONIDE 0.05 % TP CREA
1 refills | PRN
Start: 2017-04-28 — End: ?

## 2017-05-24 ENCOUNTER — Encounter: Admit: 2017-05-24 | Discharge: 2017-05-24 | Payer: MEDICARE

## 2017-06-15 ENCOUNTER — Encounter: Admit: 2017-06-15 | Discharge: 2017-06-15 | Payer: MEDICARE

## 2017-06-15 DIAGNOSIS — C7951 Secondary malignant neoplasm of bone: ICD-10-CM

## 2017-06-15 DIAGNOSIS — C61 Malignant neoplasm of prostate: Principal | ICD-10-CM

## 2017-06-15 DIAGNOSIS — F4310 Post-traumatic stress disorder, unspecified: ICD-10-CM

## 2017-06-15 DIAGNOSIS — K319 Disease of stomach and duodenum, unspecified: ICD-10-CM

## 2017-06-15 DIAGNOSIS — H9190 Unspecified hearing loss, unspecified ear: ICD-10-CM

## 2017-06-15 DIAGNOSIS — Z87442 Personal history of urinary calculi: Principal | ICD-10-CM

## 2017-06-15 DIAGNOSIS — M549 Dorsalgia, unspecified: ICD-10-CM

## 2017-06-15 DIAGNOSIS — M1990 Unspecified osteoarthritis, unspecified site: ICD-10-CM

## 2017-06-15 LAB — CBC AND DIFF
Lab: 0 10*3/uL (ref 0–0.20)
Lab: 0.2 10*3/uL (ref 0–0.45)
Lab: 1.1 10*3/uL (ref 1.0–4.8)
Lab: 10 % (ref 4–12)
Lab: 13 g/dL — ABNORMAL LOW (ref 13.5–16.5)
Lab: 308 K/UL — ABNORMAL HIGH (ref 150–400)
Lab: 31 pg (ref 26–34)
Lab: 33 g/dL (ref 32.0–36.0)
Lab: 39 % — ABNORMAL LOW (ref 40–50)
Lab: 75 % — ABNORMAL HIGH (ref 41–77)
Lab: 8.1 10*3/uL (ref 4.5–11.0)
Lab: 93 FL (ref 80–100)

## 2017-06-15 MED ORDER — PREDNISONE 5 MG PO TAB
5 mg | ORAL_TABLET | Freq: Two times a day (BID) | ORAL | 3 refills | Status: AC
Start: 2017-06-15 — End: 2017-06-25

## 2017-06-16 LAB — COMPREHENSIVE METABOLIC PANEL
Lab: 106 mg/dL — ABNORMAL HIGH (ref 70–100)
Lab: 137 MMOL/L (ref 137–147)
Lab: 3.8 MMOL/L (ref 3.5–5.1)

## 2017-06-16 LAB — PROSTATIC SPECIFIC ANTIGEN-PSA: Lab: 1.7 ng/mL (ref <6.01)

## 2017-06-25 ENCOUNTER — Encounter: Admit: 2017-06-25 | Discharge: 2017-06-25 | Payer: MEDICARE

## 2017-06-25 DIAGNOSIS — C61 Malignant neoplasm of prostate: Principal | ICD-10-CM

## 2017-06-25 MED ORDER — ABIRATERONE 500 MG PO TAB
1000 mg | ORAL_TABLET | Freq: Every day | ORAL | 3 refills | Status: AC
Start: 2017-06-25 — End: 2017-07-19

## 2017-06-25 MED ORDER — PREDNISONE 5 MG PO TAB
5 mg | ORAL_TABLET | Freq: Two times a day (BID) | ORAL | 3 refills | Status: AC
Start: 2017-06-25 — End: 2017-07-13

## 2017-06-25 MED ORDER — ABIRATERONE 500 MG PO TAB
1000 mg | ORAL_TABLET | Freq: Every day | ORAL | 3 refills | Status: CN
Start: 2017-06-25 — End: ?

## 2017-06-25 MED ORDER — ABIRATERONE 500 MG PO TAB
1000 mg | ORAL_TABLET | Freq: Every day | ORAL | 3 refills | Status: DC
Start: 2017-06-25 — End: 2017-06-25

## 2017-06-25 MED ORDER — ABIRATERONE 500 MG PO TAB
1000 mg | ORAL_TABLET | Freq: Every day | ORAL | 3 refills | Status: AC
Start: 2017-06-25 — End: 2017-06-25

## 2017-06-28 ENCOUNTER — Encounter: Admit: 2017-06-28 | Discharge: 2017-06-29 | Payer: MEDICARE

## 2017-06-28 ENCOUNTER — Encounter: Admit: 2017-06-28 | Discharge: 2017-06-28 | Payer: MEDICARE

## 2017-06-28 DIAGNOSIS — N200 Calculus of kidney: ICD-10-CM

## 2017-06-28 DIAGNOSIS — C7951 Secondary malignant neoplasm of bone: ICD-10-CM

## 2017-06-28 DIAGNOSIS — N210 Calculus in bladder: ICD-10-CM

## 2017-06-28 DIAGNOSIS — C61 Malignant neoplasm of prostate: Principal | ICD-10-CM

## 2017-06-28 LAB — COMPREHENSIVE METABOLIC PANEL
Lab: 0.7 mg/dL (ref 0.3–1.2)
Lab: 107 mg/dL — ABNORMAL HIGH (ref 70–100)
Lab: 137 MMOL/L (ref 137–147)
Lab: 3.6 g/dL (ref 3.5–5.0)
Lab: 3.7 MMOL/L (ref 3.5–5.1)
Lab: >60 mL/min (ref >60)

## 2017-06-28 LAB — POC GLUCOSE: Lab: 107 mg/dL — ABNORMAL HIGH (ref 70–100)

## 2017-06-28 LAB — CBC AND DIFF
Lab: 0 K/UL (ref 0–0.20)
Lab: 13 g/dL — ABNORMAL LOW (ref 13.5–16.5)
Lab: 286 K/UL — ABNORMAL HIGH (ref 150–400)
Lab: 31 pg (ref 26–34)
Lab: 39 % — ABNORMAL LOW (ref 40–50)
Lab: 6.1 K/UL (ref 4.5–11.0)
Lab: 93 FL (ref 80–100)

## 2017-06-28 LAB — PROSTATIC SPECIFIC ANTIGEN-PSA: Lab: 2.2 ng/mL (ref <6.01)

## 2017-06-28 MED ORDER — RP DX F-18 FDG MCI
15 | Freq: Once | INTRAVENOUS | 0 refills | Status: CP
Start: 2017-06-28 — End: ?
  Administered 2017-06-28: 17:00:00 15.3 via INTRAVENOUS

## 2017-07-13 ENCOUNTER — Encounter: Admit: 2017-07-13 | Discharge: 2017-07-13 | Payer: MEDICARE

## 2017-07-13 DIAGNOSIS — K319 Disease of stomach and duodenum, unspecified: ICD-10-CM

## 2017-07-13 DIAGNOSIS — F4310 Post-traumatic stress disorder, unspecified: ICD-10-CM

## 2017-07-13 DIAGNOSIS — C7951 Secondary malignant neoplasm of bone: ICD-10-CM

## 2017-07-13 DIAGNOSIS — M1990 Unspecified osteoarthritis, unspecified site: ICD-10-CM

## 2017-07-13 DIAGNOSIS — Z87442 Personal history of urinary calculi: Principal | ICD-10-CM

## 2017-07-13 DIAGNOSIS — C61 Malignant neoplasm of prostate: Principal | ICD-10-CM

## 2017-07-13 DIAGNOSIS — M549 Dorsalgia, unspecified: ICD-10-CM

## 2017-07-13 DIAGNOSIS — N200 Calculus of kidney: ICD-10-CM

## 2017-07-13 DIAGNOSIS — G893 Neoplasm related pain (acute) (chronic): Secondary | ICD-10-CM

## 2017-07-13 DIAGNOSIS — H9190 Unspecified hearing loss, unspecified ear: ICD-10-CM

## 2017-07-13 MED ORDER — CABAZITAXEL IVPB
20 mg/m2 | Freq: Once | INTRAVENOUS | 0 refills | Status: CN
Start: 2017-07-13 — End: ?

## 2017-07-13 MED ORDER — FAMOTIDINE (PF) 20 MG/2 ML IV SOLN
20 mg | Freq: Once | INTRAVENOUS | 0 refills | Status: CN
Start: 2017-07-13 — End: ?

## 2017-07-13 MED ORDER — PREDNISONE 10 MG PO TAB
10 mg | ORAL_TABLET | Freq: Every day | ORAL | 9 refills | Status: AC
Start: 2017-07-13 — End: 2017-07-19

## 2017-07-13 MED ORDER — MORPHINE 15 MG PO TAB
15 mg | ORAL_TABLET | ORAL | 0 refills | 7.00000 days | Status: AC | PRN
Start: 2017-07-13 — End: 2017-08-05

## 2017-07-13 MED ORDER — MORPHINE 15 MG PO TBER
15 mg | ORAL_TABLET | ORAL | 0 refills | 7.00000 days | Status: AC
Start: 2017-07-13 — End: 2017-08-05

## 2017-07-13 MED ORDER — MORPHINE 30 MG PO TBER
30 mg | ORAL_TABLET | Freq: Two times a day (BID) | ORAL | 0 refills | 7.00000 days | Status: AC
Start: 2017-07-13 — End: 2017-08-05

## 2017-07-13 MED ORDER — DEXAMETHASONE IVPB
8 mg | Freq: Once | INTRAVENOUS | 0 refills | Status: CN
Start: 2017-07-13 — End: ?

## 2017-07-19 ENCOUNTER — Encounter: Admit: 2017-07-19 | Discharge: 2017-07-19 | Payer: MEDICARE

## 2017-07-19 DIAGNOSIS — K319 Disease of stomach and duodenum, unspecified: ICD-10-CM

## 2017-07-19 DIAGNOSIS — N210 Calculus in bladder: ICD-10-CM

## 2017-07-19 DIAGNOSIS — K580 Irritable bowel syndrome with diarrhea: ICD-10-CM

## 2017-07-19 DIAGNOSIS — Z9049 Acquired absence of other specified parts of digestive tract: ICD-10-CM

## 2017-07-19 DIAGNOSIS — H9190 Unspecified hearing loss, unspecified ear: ICD-10-CM

## 2017-07-19 DIAGNOSIS — F4310 Post-traumatic stress disorder, unspecified: ICD-10-CM

## 2017-07-19 DIAGNOSIS — M1990 Unspecified osteoarthritis, unspecified site: ICD-10-CM

## 2017-07-19 DIAGNOSIS — R52 Pain, unspecified: ICD-10-CM

## 2017-07-19 DIAGNOSIS — C61 Malignant neoplasm of prostate: Principal | ICD-10-CM

## 2017-07-19 DIAGNOSIS — N200 Calculus of kidney: ICD-10-CM

## 2017-07-19 DIAGNOSIS — K5730 Diverticulosis of large intestine without perforation or abscess without bleeding: ICD-10-CM

## 2017-07-19 DIAGNOSIS — C7951 Secondary malignant neoplasm of bone: ICD-10-CM

## 2017-07-19 DIAGNOSIS — Z923 Personal history of irradiation: ICD-10-CM

## 2017-07-19 DIAGNOSIS — M549 Dorsalgia, unspecified: ICD-10-CM

## 2017-07-19 DIAGNOSIS — K449 Diaphragmatic hernia without obstruction or gangrene: ICD-10-CM

## 2017-07-19 DIAGNOSIS — Z87442 Personal history of urinary calculi: Principal | ICD-10-CM

## 2017-07-21 ENCOUNTER — Encounter: Admit: 2017-07-21 | Discharge: 2017-07-21 | Payer: MEDICARE

## 2017-07-22 ENCOUNTER — Encounter: Admit: 2017-07-22 | Discharge: 2017-07-22 | Payer: MEDICARE

## 2017-07-22 DIAGNOSIS — C61 Malignant neoplasm of prostate: ICD-10-CM

## 2017-07-22 DIAGNOSIS — R1084 Generalized abdominal pain: Principal | ICD-10-CM

## 2017-07-22 DIAGNOSIS — H9190 Unspecified hearing loss, unspecified ear: ICD-10-CM

## 2017-07-22 DIAGNOSIS — F4310 Post-traumatic stress disorder, unspecified: ICD-10-CM

## 2017-07-22 DIAGNOSIS — K319 Disease of stomach and duodenum, unspecified: ICD-10-CM

## 2017-07-22 DIAGNOSIS — M549 Dorsalgia, unspecified: ICD-10-CM

## 2017-07-22 DIAGNOSIS — Z87442 Personal history of urinary calculi: Principal | ICD-10-CM

## 2017-07-22 DIAGNOSIS — M1990 Unspecified osteoarthritis, unspecified site: ICD-10-CM

## 2017-07-23 ENCOUNTER — Encounter: Admit: 2017-07-23 | Discharge: 2017-07-23 | Payer: MEDICARE

## 2017-07-23 ENCOUNTER — Ambulatory Visit: Admit: 2017-07-23 | Discharge: 2017-07-26 | Payer: MEDICARE

## 2017-07-26 ENCOUNTER — Encounter: Admit: 2017-07-26 | Discharge: 2017-07-26 | Payer: MEDICARE

## 2017-07-26 DIAGNOSIS — E876 Hypokalemia: Principal | ICD-10-CM

## 2017-07-27 ENCOUNTER — Encounter: Admit: 2017-07-27 | Discharge: 2017-07-27 | Payer: MEDICARE

## 2017-07-29 ENCOUNTER — Encounter: Admit: 2017-07-29 | Discharge: 2017-07-29 | Payer: MEDICARE

## 2017-07-29 ENCOUNTER — Encounter: Admit: 2017-07-29 | Discharge: 2017-07-30 | Payer: MEDICARE

## 2017-07-29 DIAGNOSIS — C7951 Secondary malignant neoplasm of bone: ICD-10-CM

## 2017-07-29 DIAGNOSIS — C61 Malignant neoplasm of prostate: Principal | ICD-10-CM

## 2017-07-29 DIAGNOSIS — E876 Hypokalemia: ICD-10-CM

## 2017-07-29 LAB — CBC AND DIFF
Lab: 0 10*3/uL (ref 0–0.20)
Lab: 0.2 10*3/uL (ref 0–0.45)
Lab: 6.6 10*3/uL (ref 4.5–11.0)

## 2017-07-29 MED ORDER — FAMOTIDINE (PF) 20 MG/2 ML IV SOLN
20 mg | Freq: Once | INTRAVENOUS | 0 refills | Status: CP
Start: 2017-07-29 — End: ?

## 2017-07-29 MED ORDER — CABAZITAXEL IVPB
20 mg/m2 | Freq: Once | INTRAVENOUS | 0 refills | Status: CP
Start: 2017-07-29 — End: ?
  Administered 2017-07-29 (×2): 48.6 mg via INTRAVENOUS

## 2017-07-29 MED ORDER — DEXAMETHASONE IVPB
8 mg | Freq: Once | INTRAVENOUS | 0 refills | Status: CP
Start: 2017-07-29 — End: ?
  Administered 2017-07-29 (×2): 8 mg via INTRAVENOUS

## 2017-07-30 ENCOUNTER — Encounter: Admit: 2017-07-30 | Discharge: 2017-07-30 | Payer: MEDICARE

## 2017-07-30 LAB — COMPREHENSIVE METABOLIC PANEL
Lab: 0.7 mg/dL (ref 0.3–1.2)
Lab: 0.9 mg/dL (ref 0.4–1.24)
Lab: 119 mg/dL — ABNORMAL HIGH (ref 70–100)
Lab: 137 MMOL/L — ABNORMAL LOW (ref 137–147)
Lab: 15 mg/dL (ref 7–25)
Lab: 21 U/L (ref 7–56)
Lab: 27 U/L (ref 7–40)
Lab: 3.1 MMOL/L — ABNORMAL LOW (ref 3.5–5.1)
Lab: 3.9 g/dL (ref 3.5–5.0)
Lab: 33 MMOL/L — ABNORMAL HIGH (ref 21–30)
Lab: 6.3 g/dL (ref 6.0–8.0)
Lab: 7 10*3/uL (ref 3–12)
Lab: 9 mg/dL (ref 8.5–10.6)
Lab: 97 MMOL/L — ABNORMAL LOW (ref 98–110)
Lab: >60 mL/min (ref >60)
Lab: >60 mL/min (ref >60)

## 2017-08-03 ENCOUNTER — Encounter: Admit: 2017-08-03 | Discharge: 2017-08-03 | Payer: MEDICARE

## 2017-08-05 ENCOUNTER — Encounter: Admit: 2017-08-05 | Discharge: 2017-08-05 | Payer: MEDICARE

## 2017-08-05 DIAGNOSIS — Z87442 Personal history of urinary calculi: Principal | ICD-10-CM

## 2017-08-05 DIAGNOSIS — F4310 Post-traumatic stress disorder, unspecified: ICD-10-CM

## 2017-08-05 DIAGNOSIS — G893 Neoplasm related pain (acute) (chronic): ICD-10-CM

## 2017-08-05 DIAGNOSIS — Z923 Personal history of irradiation: ICD-10-CM

## 2017-08-05 DIAGNOSIS — H9190 Unspecified hearing loss, unspecified ear: ICD-10-CM

## 2017-08-05 DIAGNOSIS — K319 Disease of stomach and duodenum, unspecified: ICD-10-CM

## 2017-08-05 DIAGNOSIS — M1990 Unspecified osteoarthritis, unspecified site: ICD-10-CM

## 2017-08-05 DIAGNOSIS — C7951 Secondary malignant neoplasm of bone: ICD-10-CM

## 2017-08-05 DIAGNOSIS — Z9049 Acquired absence of other specified parts of digestive tract: ICD-10-CM

## 2017-08-05 DIAGNOSIS — Z87891 Personal history of nicotine dependence: ICD-10-CM

## 2017-08-05 DIAGNOSIS — M549 Dorsalgia, unspecified: ICD-10-CM

## 2017-08-05 DIAGNOSIS — C61 Malignant neoplasm of prostate: Principal | ICD-10-CM

## 2017-08-05 LAB — CBC AND DIFF
Lab: 12 g/dL — ABNORMAL LOW (ref 13.5–16.5)
Lab: 13 % (ref 11–15)
Lab: 31 pg (ref 26–34)
Lab: 33 g/dL (ref 32.0–36.0)
Lab: 37 % — ABNORMAL LOW (ref 40–50)
Lab: 4 M/UL — ABNORMAL LOW (ref 4.4–5.5)
Lab: 4.7 K/UL (ref 4.5–11.0)
Lab: 93 FL (ref 80–100)

## 2017-08-05 MED ORDER — POTASSIUM CHLORIDE 20 MEQ PO TBTQ
20 meq | ORAL_TABLET | Freq: Two times a day (BID) | ORAL | 3 refills | 30.00000 days | Status: AC
Start: 2017-08-05 — End: 2017-12-15

## 2017-08-05 MED ORDER — FUROSEMIDE 40 MG PO TAB
20 mg | ORAL_TABLET | Freq: Every morning | ORAL | 3 refills | 90.00000 days | Status: AC
Start: 2017-08-05 — End: 2018-03-11

## 2017-08-05 MED ORDER — MORPHINE 30 MG PO TBER
30 mg | ORAL_TABLET | Freq: Two times a day (BID) | ORAL | 0 refills | 7.00000 days | Status: AC
Start: 2017-08-05 — End: 2017-08-27

## 2017-08-05 MED ORDER — MORPHINE 15 MG PO TAB
15 mg | ORAL_TABLET | ORAL | 0 refills | 7.00000 days | Status: AC | PRN
Start: 2017-08-05 — End: 2017-08-27

## 2017-08-05 MED ORDER — MORPHINE 15 MG PO TBER
15 mg | ORAL_TABLET | ORAL | 0 refills | 7.00000 days | Status: AC
Start: 2017-08-05 — End: 2017-08-27

## 2017-08-06 LAB — COMPREHENSIVE METABOLIC PANEL
Lab: 140 MMOL/L (ref 137–147)
Lab: 4.3 MMOL/L (ref 3.5–5.1)

## 2017-08-09 ENCOUNTER — Encounter: Admit: 2017-08-09 | Discharge: 2017-08-09 | Payer: MEDICARE

## 2017-08-17 ENCOUNTER — Encounter: Admit: 2017-08-17 | Discharge: 2017-08-17 | Payer: MEDICARE

## 2017-08-17 MED ORDER — DEXAMETHASONE IVPB
8 mg | Freq: Once | INTRAVENOUS | 0 refills | Status: CN
Start: 2017-08-17 — End: ?

## 2017-08-17 MED ORDER — FAMOTIDINE (PF) 20 MG/2 ML IV SOLN
20 mg | Freq: Once | INTRAVENOUS | 0 refills | Status: CN
Start: 2017-08-17 — End: ?

## 2017-08-17 MED ORDER — CABAZITAXEL IVPB
20 mg/m2 | Freq: Once | INTRAVENOUS | 0 refills | Status: CN
Start: 2017-08-17 — End: ?

## 2017-08-17 MED ORDER — CABAZITAXEL IVPB
15 mg/m2 | Freq: Once | INTRAVENOUS | 0 refills | Status: CN
Start: 2017-08-17 — End: ?

## 2017-08-19 ENCOUNTER — Encounter: Admit: 2017-08-19 | Discharge: 2017-08-19 | Payer: MEDICARE

## 2017-08-19 ENCOUNTER — Encounter: Admit: 2017-08-19 | Discharge: 2017-08-20 | Payer: MEDICARE

## 2017-08-19 DIAGNOSIS — C7951 Secondary malignant neoplasm of bone: ICD-10-CM

## 2017-08-19 DIAGNOSIS — C61 Malignant neoplasm of prostate: Principal | ICD-10-CM

## 2017-08-19 LAB — COMPREHENSIVE METABOLIC PANEL
Lab: 0.5 mg/dL (ref 0.3–1.2)
Lab: 0.9 mg/dL (ref 0.4–1.24)
Lab: 104 MMOL/L — ABNORMAL LOW (ref 98–110)
Lab: 137 MMOL/L — ABNORMAL LOW (ref 137–147)
Lab: 139 mg/dL — ABNORMAL HIGH (ref 70–100)
Lab: 15 mg/dL (ref 7–25)
Lab: 154 U/L — ABNORMAL HIGH (ref 25–110)
Lab: 17 U/L (ref 7–56)
Lab: 19 U/L (ref 7–40)
Lab: 25 MMOL/L (ref 21–30)
Lab: 4 MMOL/L — ABNORMAL LOW (ref 3.5–5.1)
Lab: 6.2 g/dL (ref 6.0–8.0)
Lab: 9.1 mg/dL (ref 8.5–10.6)
Lab: >60 mL/min (ref >60)
Lab: >60 mL/min (ref >60)

## 2017-08-19 LAB — CBC AND DIFF: Lab: 0 10*3/uL (ref 0–0.20)

## 2017-08-19 MED ORDER — FAMOTIDINE (PF) 20 MG/2 ML IV SOLN
20 mg | Freq: Once | INTRAVENOUS | 0 refills | Status: CP
Start: 2017-08-19 — End: ?
  Administered 2017-08-19: 18:00:00 20 mg via INTRAVENOUS

## 2017-08-19 MED ORDER — CABAZITAXEL IVPB
15 mg/m2 | Freq: Once | INTRAVENOUS | 0 refills | Status: CP
Start: 2017-08-19 — End: ?
  Administered 2017-08-19 (×2): 36.5 mg via INTRAVENOUS

## 2017-08-19 MED ORDER — DEXAMETHASONE SODIUM PHOSPHATE 10 MG/ML IJ SOLN
8 mg | Freq: Once | INTRAVENOUS | 0 refills | Status: CP
Start: 2017-08-19 — End: ?
  Administered 2017-08-19: 19:00:00 8 mg via INTRAVENOUS

## 2017-08-19 MED ORDER — DEXAMETHASONE IVPB
8 mg | Freq: Once | INTRAVENOUS | 0 refills | Status: DC
Start: 2017-08-19 — End: 2017-08-19

## 2017-08-27 ENCOUNTER — Encounter: Admit: 2017-08-27 | Discharge: 2017-08-27 | Payer: MEDICARE

## 2017-08-27 DIAGNOSIS — G893 Neoplasm related pain (acute) (chronic): Principal | ICD-10-CM

## 2017-08-27 MED ORDER — MORPHINE 15 MG PO TAB
15 mg | ORAL_TABLET | ORAL | 0 refills | 7.00000 days | Status: AC | PRN
Start: 2017-08-27 — End: 2017-09-09

## 2017-08-27 MED ORDER — MORPHINE 30 MG PO TBER
30 mg | ORAL_TABLET | Freq: Two times a day (BID) | ORAL | 0 refills | 7.00000 days | Status: AC
Start: 2017-08-27 — End: 2017-09-09

## 2017-08-27 MED ORDER — MORPHINE 15 MG PO TBER
15 mg | ORAL_TABLET | ORAL | 0 refills | 7.00000 days | Status: AC
Start: 2017-08-27 — End: 2017-09-09

## 2017-09-09 ENCOUNTER — Encounter: Admit: 2017-09-09 | Discharge: 2017-09-09 | Payer: MEDICARE

## 2017-09-09 DIAGNOSIS — M549 Dorsalgia, unspecified: ICD-10-CM

## 2017-09-09 DIAGNOSIS — I890 Lymphedema, not elsewhere classified: ICD-10-CM

## 2017-09-09 DIAGNOSIS — Z87442 Personal history of urinary calculi: Principal | ICD-10-CM

## 2017-09-09 DIAGNOSIS — C61 Malignant neoplasm of prostate: Principal | ICD-10-CM

## 2017-09-09 DIAGNOSIS — C7951 Secondary malignant neoplasm of bone: ICD-10-CM

## 2017-09-09 DIAGNOSIS — G893 Neoplasm related pain (acute) (chronic): Secondary | ICD-10-CM

## 2017-09-09 DIAGNOSIS — Z923 Personal history of irradiation: ICD-10-CM

## 2017-09-09 DIAGNOSIS — K319 Disease of stomach and duodenum, unspecified: ICD-10-CM

## 2017-09-09 DIAGNOSIS — Z87891 Personal history of nicotine dependence: ICD-10-CM

## 2017-09-09 DIAGNOSIS — F4310 Post-traumatic stress disorder, unspecified: ICD-10-CM

## 2017-09-09 DIAGNOSIS — M1990 Unspecified osteoarthritis, unspecified site: ICD-10-CM

## 2017-09-09 DIAGNOSIS — H9190 Unspecified hearing loss, unspecified ear: ICD-10-CM

## 2017-09-09 LAB — CBC AND DIFF: Lab: 0.2 10*3/uL (ref 0–0.45)

## 2017-09-09 LAB — COMPREHENSIVE METABOLIC PANEL
Lab: >60 mL/min (ref >60)
Lab: >60 mL/min (ref >60)

## 2017-09-09 MED ORDER — MORPHINE 30 MG PO TBER
30 mg | ORAL_TABLET | ORAL | 0 refills | 7.00000 days | Status: AC
Start: 2017-09-09 — End: 2017-10-19

## 2017-09-09 MED ORDER — CABAZITAXEL IVPB
20 mg/m2 | Freq: Once | INTRAVENOUS | 0 refills | Status: DC
Start: 2017-09-09 — End: 2017-09-09

## 2017-09-09 MED ORDER — DEXAMETHASONE SODIUM PHOSPHATE 10 MG/ML IJ SOLN
8 mg | Freq: Once | INTRAVENOUS | 0 refills | Status: CP
Start: 2017-09-09 — End: ?
  Administered 2017-09-09: 19:00:00 8 mg via INTRAVENOUS

## 2017-09-09 MED ORDER — MORPHINE 15 MG PO TAB
15 mg | ORAL_TABLET | ORAL | 0 refills | 7.00000 days | Status: AC | PRN
Start: 2017-09-09 — End: 2017-09-24

## 2017-09-09 MED ORDER — CABAZITAXEL IVPB
15 mg/m2 | Freq: Once | INTRAVENOUS | 0 refills | Status: CP
Start: 2017-09-09 — End: ?
  Administered 2017-09-09 (×2): 36.5 mg via INTRAVENOUS

## 2017-09-09 MED ORDER — FAMOTIDINE (PF) 20 MG/2 ML IV SOLN
20 mg | Freq: Once | INTRAVENOUS | 0 refills | Status: CP
Start: 2017-09-09 — End: ?
  Administered 2017-09-09: 19:00:00 20 mg via INTRAVENOUS

## 2017-09-09 MED ORDER — METOLAZONE 2.5 MG PO TAB
2.5 mg | ORAL_TABLET | Freq: Every day | ORAL | 0 refills | Status: SS
Start: 2017-09-09 — End: 2018-01-21

## 2017-09-10 ENCOUNTER — Encounter: Admit: 2017-09-10 | Discharge: 2017-09-10 | Payer: MEDICARE

## 2017-09-10 DIAGNOSIS — C61 Malignant neoplasm of prostate: Principal | ICD-10-CM

## 2017-09-10 LAB — PROSTATIC SPECIFIC ANTIGEN-PSA: Lab: 3 ng/mL (ref <6.01)

## 2017-09-10 MED ORDER — CABAZITAXEL IVPB
15 mg/m2 | Freq: Once | INTRAVENOUS | 0 refills | Status: CN
Start: 2017-09-10 — End: ?

## 2017-09-10 MED ORDER — FAMOTIDINE (PF) 20 MG/2 ML IV SOLN
20 mg | Freq: Once | INTRAVENOUS | 0 refills | Status: CN
Start: 2017-09-10 — End: ?

## 2017-09-16 ENCOUNTER — Encounter: Admit: 2017-09-16 | Discharge: 2017-09-16 | Payer: MEDICARE

## 2017-09-16 DIAGNOSIS — C7951 Secondary malignant neoplasm of bone: ICD-10-CM

## 2017-09-16 DIAGNOSIS — C61 Malignant neoplasm of prostate: Principal | ICD-10-CM

## 2017-09-16 DIAGNOSIS — I890 Lymphedema, not elsewhere classified: ICD-10-CM

## 2017-09-16 LAB — CBC AND DIFF
Lab: 12 g/dL — ABNORMAL LOW (ref 13.5–16.5)
Lab: 37 % — ABNORMAL LOW (ref 40–50)
Lab: 4.1 M/UL — ABNORMAL LOW (ref 4.4–5.5)
Lab: 7.2 10*3/uL (ref 4.5–11.0)
Lab: 91 FL — ABNORMAL LOW (ref 80–100)

## 2017-09-17 LAB — COMPREHENSIVE METABOLIC PANEL
Lab: 137 MMOL/L (ref 137–147)
Lab: 4.2 MMOL/L — ABNORMAL LOW (ref 3.5–5.1)

## 2017-09-24 ENCOUNTER — Encounter: Admit: 2017-09-24 | Discharge: 2017-09-24 | Payer: MEDICARE

## 2017-09-24 MED ORDER — MORPHINE 15 MG PO TAB
ORAL_TABLET | ORAL | 0 refills | 7.00000 days | Status: AC | PRN
Start: 2017-09-24 — End: 2017-10-19

## 2017-09-27 ENCOUNTER — Encounter: Admit: 2017-09-27 | Discharge: 2017-09-27 | Payer: MEDICARE

## 2017-09-27 DIAGNOSIS — C7951 Secondary malignant neoplasm of bone: ICD-10-CM

## 2017-09-27 DIAGNOSIS — C61 Malignant neoplasm of prostate: Principal | ICD-10-CM

## 2017-09-27 DIAGNOSIS — Z9079 Acquired absence of other genital organ(s): ICD-10-CM

## 2017-09-27 LAB — POC GLUCOSE: Lab: 111 mg/dL — ABNORMAL HIGH (ref 70–100)

## 2017-09-27 MED ORDER — RP DX F-18 FDG MCI
15 | Freq: Once | INTRAVENOUS | 0 refills | Status: CP
Start: 2017-09-27 — End: ?
  Administered 2017-09-27: 18:00:00 17.6 via INTRAVENOUS

## 2017-09-30 ENCOUNTER — Encounter: Admit: 2017-09-30 | Discharge: 2017-09-30 | Payer: MEDICARE

## 2017-09-30 DIAGNOSIS — C61 Malignant neoplasm of prostate: Principal | ICD-10-CM

## 2017-10-01 ENCOUNTER — Encounter: Admit: 2017-10-01 | Discharge: 2017-10-01 | Payer: MEDICARE

## 2017-10-01 ENCOUNTER — Encounter: Admit: 2017-10-01 | Discharge: 2017-10-02 | Payer: MEDICARE

## 2017-10-01 DIAGNOSIS — Z923 Personal history of irradiation: ICD-10-CM

## 2017-10-01 DIAGNOSIS — K449 Diaphragmatic hernia without obstruction or gangrene: ICD-10-CM

## 2017-10-01 DIAGNOSIS — C7951 Secondary malignant neoplasm of bone: ICD-10-CM

## 2017-10-01 DIAGNOSIS — C61 Malignant neoplasm of prostate: Principal | ICD-10-CM

## 2017-10-01 DIAGNOSIS — M1990 Unspecified osteoarthritis, unspecified site: ICD-10-CM

## 2017-10-01 DIAGNOSIS — F4310 Post-traumatic stress disorder, unspecified: ICD-10-CM

## 2017-10-01 DIAGNOSIS — H9190 Unspecified hearing loss, unspecified ear: ICD-10-CM

## 2017-10-01 DIAGNOSIS — Z9221 Personal history of antineoplastic chemotherapy: ICD-10-CM

## 2017-10-01 DIAGNOSIS — K5730 Diverticulosis of large intestine without perforation or abscess without bleeding: ICD-10-CM

## 2017-10-01 DIAGNOSIS — N200 Calculus of kidney: ICD-10-CM

## 2017-10-01 DIAGNOSIS — K319 Disease of stomach and duodenum, unspecified: ICD-10-CM

## 2017-10-01 DIAGNOSIS — M549 Dorsalgia, unspecified: ICD-10-CM

## 2017-10-01 DIAGNOSIS — Z87442 Personal history of urinary calculi: Principal | ICD-10-CM

## 2017-10-01 LAB — COMPREHENSIVE METABOLIC PANEL
Lab: 0.4 mg/dL (ref 0.3–1.2)
Lab: 0.9 mg/dL (ref 0.4–1.24)
Lab: 105 MMOL/L — ABNORMAL LOW (ref 98–110)
Lab: 130 mg/dL — ABNORMAL HIGH (ref 70–100)
Lab: 15 U/L (ref 7–56)
Lab: 18 mg/dL (ref 7–25)
Lab: 20 U/L (ref 7–40)
Lab: 25 MMOL/L (ref 21–30)
Lab: 3.7 g/dL (ref 3.5–5.0)
Lab: 5.8 g/dL — ABNORMAL LOW (ref 6.0–8.0)
Lab: 7 10*3/uL (ref 3–12)
Lab: 8.9 mg/dL (ref 8.5–10.6)
Lab: 90 U/L — ABNORMAL LOW (ref 25–110)
Lab: >60 mL/min (ref >60)
Lab: >60 mL/min (ref >60)

## 2017-10-01 LAB — CBC AND DIFF
Lab: 0 10*3/uL (ref 0–0.20)
Lab: 0.1 10*3/uL (ref 0–0.45)

## 2017-10-01 MED ORDER — RP TX RA-223 RADIUM DICHLORIDE 27 UCI/ML
0 refills | Status: SS
Start: 2017-10-01 — End: 2018-01-21

## 2017-10-06 ENCOUNTER — Encounter: Admit: 2017-10-06 | Discharge: 2017-10-06 | Payer: MEDICARE

## 2017-10-06 DIAGNOSIS — L989 Disorder of the skin and subcutaneous tissue, unspecified: Principal | ICD-10-CM

## 2017-10-08 ENCOUNTER — Encounter: Admit: 2017-10-08 | Discharge: 2017-10-08 | Payer: MEDICARE

## 2017-10-08 DIAGNOSIS — C61 Malignant neoplasm of prostate: Principal | ICD-10-CM

## 2017-10-12 ENCOUNTER — Encounter: Admit: 2017-10-12 | Discharge: 2017-10-12 | Payer: MEDICARE

## 2017-10-12 DIAGNOSIS — C61 Malignant neoplasm of prostate: Principal | ICD-10-CM

## 2017-10-19 ENCOUNTER — Encounter: Admit: 2017-10-19 | Discharge: 2017-10-19 | Payer: MEDICARE

## 2017-10-19 DIAGNOSIS — G893 Neoplasm related pain (acute) (chronic): Principal | ICD-10-CM

## 2017-10-19 MED ORDER — MORPHINE 15 MG PO TAB
ORAL_TABLET | ORAL | 0 refills | 7.00000 days | Status: AC | PRN
Start: 2017-10-19 — End: 2017-10-19

## 2017-10-19 MED ORDER — MORPHINE 30 MG PO TBER
30 mg | ORAL_TABLET | ORAL | 0 refills | 7.00000 days | Status: AC
Start: 2017-10-19 — End: 2017-11-24

## 2017-10-19 MED ORDER — MORPHINE 15 MG PO TAB
ORAL_TABLET | ORAL | 0 refills | 7.00000 days | Status: AC | PRN
Start: 2017-10-19 — End: 2017-10-20

## 2017-10-20 ENCOUNTER — Encounter: Admit: 2017-10-20 | Discharge: 2017-10-21 | Payer: MEDICARE

## 2017-10-20 ENCOUNTER — Encounter: Admit: 2017-10-20 | Discharge: 2017-10-20 | Payer: MEDICARE

## 2017-10-20 MED ORDER — MORPHINE 15 MG PO TAB
ORAL_TABLET | ORAL | 0 refills | 7.00000 days | Status: AC
Start: 2017-10-20 — End: 2017-11-24

## 2017-10-21 ENCOUNTER — Encounter: Admit: 2017-10-21 | Discharge: 2017-10-21 | Payer: MEDICARE

## 2017-10-21 DIAGNOSIS — C61 Malignant neoplasm of prostate: Principal | ICD-10-CM

## 2017-11-03 ENCOUNTER — Encounter: Admit: 2017-11-03 | Discharge: 2017-11-03 | Payer: MEDICARE

## 2017-11-03 DIAGNOSIS — C61 Malignant neoplasm of prostate: Principal | ICD-10-CM

## 2017-11-08 ENCOUNTER — Encounter: Admit: 2017-11-08 | Discharge: 2017-11-08 | Payer: MEDICARE

## 2017-11-09 ENCOUNTER — Encounter: Admit: 2017-11-09 | Discharge: 2017-11-09 | Payer: MEDICARE

## 2017-11-12 ENCOUNTER — Encounter: Admit: 2017-11-12 | Discharge: 2017-11-12 | Payer: MEDICARE

## 2017-11-12 DIAGNOSIS — H9190 Unspecified hearing loss, unspecified ear: ICD-10-CM

## 2017-11-12 DIAGNOSIS — K589 Irritable bowel syndrome without diarrhea: ICD-10-CM

## 2017-11-12 DIAGNOSIS — M549 Dorsalgia, unspecified: ICD-10-CM

## 2017-11-12 DIAGNOSIS — R339 Retention of urine, unspecified: ICD-10-CM

## 2017-11-12 DIAGNOSIS — F4310 Post-traumatic stress disorder, unspecified: ICD-10-CM

## 2017-11-12 DIAGNOSIS — C61 Malignant neoplasm of prostate: Principal | ICD-10-CM

## 2017-11-12 DIAGNOSIS — C7951 Secondary malignant neoplasm of bone: ICD-10-CM

## 2017-11-12 DIAGNOSIS — M1990 Unspecified osteoarthritis, unspecified site: ICD-10-CM

## 2017-11-12 DIAGNOSIS — Z87442 Personal history of urinary calculi: Principal | ICD-10-CM

## 2017-11-12 DIAGNOSIS — K319 Disease of stomach and duodenum, unspecified: ICD-10-CM

## 2017-11-12 LAB — COMPREHENSIVE METABOLIC PANEL
Lab: 0.5 mg/dL (ref 0.3–1.2)
Lab: 0.7 mg/dL — ABNORMAL LOW (ref 0.4–1.24)
Lab: 102 MMOL/L (ref 98–110)
Lab: 111 mg/dL — ABNORMAL HIGH (ref 70–100)
Lab: 13 mg/dL (ref 7–25)
Lab: 135 U/L — ABNORMAL HIGH (ref 25–110)
Lab: 136 MMOL/L — ABNORMAL LOW (ref 137–147)
Lab: 14 U/L (ref 7–56)
Lab: 20 U/L (ref 7–40)
Lab: 25 MMOL/L (ref 21–30)
Lab: 3.6 MMOL/L (ref 3.5–5.1)
Lab: 3.6 g/dL (ref 3.5–5.0)
Lab: 5.8 g/dL — ABNORMAL LOW (ref 6.0–8.0)
Lab: 8.7 mg/dL (ref 8.5–10.6)
Lab: 9 (ref 3–12)
Lab: >60 mL/min (ref >60)
Lab: >60 mL/min (ref >60)

## 2017-11-12 LAB — CBC AND DIFF
Lab: 12 g/dL — ABNORMAL LOW (ref 13.5–16.5)
Lab: 3.9 M/UL — ABNORMAL LOW (ref 4.4–5.5)
Lab: 30 pg (ref 26–34)
Lab: 35 % — ABNORMAL LOW (ref 40–50)
Lab: 4.2 10*3/uL — ABNORMAL LOW (ref 4.5–11.0)

## 2017-11-13 LAB — PROSTATIC SPECIFIC ANTIGEN-PSA: Lab: 9.8 ng/mL — ABNORMAL HIGH (ref <6.01)

## 2017-11-15 ENCOUNTER — Encounter: Admit: 2017-11-15 | Discharge: 2017-11-15 | Payer: MEDICARE

## 2017-11-15 ENCOUNTER — Ambulatory Visit: Admit: 2017-11-15 | Discharge: 2017-11-16 | Payer: MEDICARE

## 2017-11-15 DIAGNOSIS — M549 Dorsalgia, unspecified: ICD-10-CM

## 2017-11-15 DIAGNOSIS — Z87442 Personal history of urinary calculi: Principal | ICD-10-CM

## 2017-11-15 DIAGNOSIS — H9190 Unspecified hearing loss, unspecified ear: ICD-10-CM

## 2017-11-15 DIAGNOSIS — F4310 Post-traumatic stress disorder, unspecified: ICD-10-CM

## 2017-11-15 DIAGNOSIS — M1990 Unspecified osteoarthritis, unspecified site: ICD-10-CM

## 2017-11-15 DIAGNOSIS — K319 Disease of stomach and duodenum, unspecified: ICD-10-CM

## 2017-11-16 DIAGNOSIS — Z960 Presence of urogenital implants: Principal | ICD-10-CM

## 2017-11-19 ENCOUNTER — Encounter: Admit: 2017-11-19 | Discharge: 2017-11-19 | Payer: MEDICARE

## 2017-11-19 DIAGNOSIS — Z87442 Personal history of urinary calculi: Principal | ICD-10-CM

## 2017-11-19 DIAGNOSIS — M1990 Unspecified osteoarthritis, unspecified site: ICD-10-CM

## 2017-11-19 DIAGNOSIS — K319 Disease of stomach and duodenum, unspecified: ICD-10-CM

## 2017-11-19 DIAGNOSIS — H9190 Unspecified hearing loss, unspecified ear: ICD-10-CM

## 2017-11-19 DIAGNOSIS — F4310 Post-traumatic stress disorder, unspecified: ICD-10-CM

## 2017-11-19 DIAGNOSIS — M549 Dorsalgia, unspecified: ICD-10-CM

## 2017-11-24 ENCOUNTER — Encounter: Admit: 2017-11-24 | Discharge: 2017-11-24 | Payer: MEDICARE

## 2017-11-24 DIAGNOSIS — G893 Neoplasm related pain (acute) (chronic): Principal | ICD-10-CM

## 2017-11-24 MED ORDER — MORPHINE 15 MG PO TAB
ORAL_TABLET | ORAL | 0 refills | 7.00000 days | Status: AC
Start: 2017-11-24 — End: 2017-12-27

## 2017-11-24 MED ORDER — MORPHINE 30 MG PO TBER
30 mg | ORAL_TABLET | ORAL | 0 refills | 7.00000 days | Status: AC
Start: 2017-11-24 — End: 2017-12-27

## 2017-11-25 ENCOUNTER — Encounter: Admit: 2017-11-25 | Discharge: 2017-11-25 | Payer: MEDICARE

## 2017-11-25 DIAGNOSIS — C7951 Secondary malignant neoplasm of bone: ICD-10-CM

## 2017-11-25 DIAGNOSIS — C61 Malignant neoplasm of prostate: Principal | ICD-10-CM

## 2017-11-25 LAB — COMPREHENSIVE METABOLIC PANEL
Lab: 140 MMOL/L (ref 137–147)
Lab: 3.7 MMOL/L (ref 3.5–5.1)

## 2017-11-25 LAB — CBC AND DIFF
Lab: 0 10*3/uL (ref 0–0.20)
Lab: 0.1 10*3/uL (ref 0–0.45)
Lab: 0.5 10*3/uL (ref 0–0.80)
Lab: 1 % (ref >60)
Lab: 1 10*3/uL (ref 1.0–4.8)
Lab: 12 % (ref 4–12)
Lab: 12 g/dL — ABNORMAL LOW (ref 13.5–16.5)
Lab: 14 % (ref 11–15)
Lab: 2.7 10*3/uL (ref 1.8–7.0)
Lab: 23 % — ABNORMAL LOW (ref 24–44)
Lab: 3 % (ref >60)
Lab: 30 pg (ref 26–34)
Lab: 34 g/dL (ref 32.0–36.0)
Lab: 37 % — ABNORMAL LOW (ref 40–50)
Lab: 4.1 M/UL — ABNORMAL LOW (ref 4.4–5.5)
Lab: 4.4 10*3/uL — ABNORMAL LOW (ref 4.5–11.0)
Lab: 412 K/UL — ABNORMAL HIGH (ref 150–400)
Lab: 61 % (ref 41–77)
Lab: 7.3 FL (ref 7–11)
Lab: 89 FL (ref 80–100)

## 2017-11-26 LAB — PROSTATIC SPECIFIC ANTIGEN-PSA: Lab: 14 ng/mL — ABNORMAL HIGH (ref <6.01)

## 2017-11-30 ENCOUNTER — Encounter: Admit: 2017-11-30 | Discharge: 2017-11-30 | Payer: MEDICARE

## 2017-11-30 DIAGNOSIS — R300 Dysuria: Principal | ICD-10-CM

## 2017-11-30 LAB — URINALYSIS DIPSTICK REFLEX TO CULTURE
Lab: 1 K/UL — ABNORMAL HIGH (ref 1.003–1.035)
Lab: 6 FL (ref 5.0–8.0)
Lab: NEGATIVE % (ref 24–44)
Lab: NEGATIVE % (ref 41–77)
Lab: NEGATIVE % (ref 4–12)
Lab: NEGATIVE % — ABNORMAL LOW (ref 0–5)
Lab: NEGATIVE 10*3/uL (ref 0–0.45)
Lab: NEGATIVE K/UL (ref 0–0.80)
Lab: NEGATIVE K/UL (ref 1.0–4.8)

## 2017-11-30 LAB — URINALYSIS MICROSCOPIC REFLEX TO CULTURE

## 2017-12-09 ENCOUNTER — Encounter: Admit: 2017-12-09 | Discharge: 2017-12-09 | Payer: MEDICARE

## 2017-12-09 DIAGNOSIS — C61 Malignant neoplasm of prostate: Principal | ICD-10-CM

## 2017-12-14 ENCOUNTER — Ambulatory Visit: Admit: 2017-12-14 | Discharge: 2017-12-14 | Payer: MEDICARE

## 2017-12-14 DIAGNOSIS — C7951 Secondary malignant neoplasm of bone: ICD-10-CM

## 2017-12-14 DIAGNOSIS — C61 Malignant neoplasm of prostate: Principal | ICD-10-CM

## 2017-12-14 MED ORDER — GADOBENATE DIMEGLUMINE 529 MG/ML (0.1MMOL/0.2ML) IV SOLN
20 mL | Freq: Once | INTRAVENOUS | 0 refills | Status: CP
Start: 2017-12-14 — End: ?
  Administered 2017-12-14: 20:00:00 20 mL via INTRAVENOUS

## 2017-12-15 ENCOUNTER — Encounter: Admit: 2017-12-15 | Discharge: 2017-12-15 | Payer: MEDICARE

## 2017-12-15 DIAGNOSIS — N132 Hydronephrosis with renal and ureteral calculous obstruction: ICD-10-CM

## 2017-12-15 DIAGNOSIS — Z9079 Acquired absence of other genital organ(s): ICD-10-CM

## 2017-12-15 DIAGNOSIS — C61 Malignant neoplasm of prostate: Principal | ICD-10-CM

## 2017-12-15 DIAGNOSIS — Z87442 Personal history of urinary calculi: Principal | ICD-10-CM

## 2017-12-15 DIAGNOSIS — H9190 Unspecified hearing loss, unspecified ear: ICD-10-CM

## 2017-12-15 DIAGNOSIS — F4310 Post-traumatic stress disorder, unspecified: ICD-10-CM

## 2017-12-15 DIAGNOSIS — Z923 Personal history of irradiation: ICD-10-CM

## 2017-12-15 DIAGNOSIS — G8929 Other chronic pain: ICD-10-CM

## 2017-12-15 DIAGNOSIS — M549 Dorsalgia, unspecified: ICD-10-CM

## 2017-12-15 DIAGNOSIS — C7951 Secondary malignant neoplasm of bone: ICD-10-CM

## 2017-12-15 DIAGNOSIS — K319 Disease of stomach and duodenum, unspecified: ICD-10-CM

## 2017-12-15 DIAGNOSIS — M1990 Unspecified osteoarthritis, unspecified site: ICD-10-CM

## 2017-12-15 DIAGNOSIS — Z9049 Acquired absence of other specified parts of digestive tract: ICD-10-CM

## 2017-12-15 MED ORDER — POTASSIUM CHLORIDE 10 MEQ PO TBTQ
10 meq | ORAL_TABLET | Freq: Every day | ORAL | 3 refills | 30.00000 days | Status: AC
Start: 2017-12-15 — End: 2018-01-03

## 2017-12-16 ENCOUNTER — Encounter: Admit: 2017-12-16 | Discharge: 2017-12-16 | Payer: MEDICARE

## 2017-12-16 LAB — CBC AND DIFF
Lab: 0 % (ref >60)
Lab: 0 10*3/uL (ref 0–0.20)
Lab: 0.1 10*3/uL (ref 0–0.45)
Lab: 0.5 10*3/uL (ref 0–0.80)
Lab: 0.5 10*3/uL — ABNORMAL LOW (ref 1.0–4.8)
Lab: 10 % (ref 4–12)
Lab: 11 % — ABNORMAL LOW (ref 24–44)
Lab: 14 % (ref 11–15)
Lab: 29 pg (ref 26–34)
Lab: 3 % (ref >60)
Lab: 3.6 10*3/uL (ref 1.8–7.0)
Lab: 3.7 M/UL — ABNORMAL LOW (ref 4.4–5.5)
Lab: 32 % — ABNORMAL LOW (ref 40–50)
Lab: 33 g/dL (ref 32.0–36.0)
Lab: 363 10*3/uL — ABNORMAL HIGH (ref 150–400)
Lab: 4.8 10*3/uL (ref 4.5–11.0)
Lab: 7.5 FL — ABNORMAL HIGH (ref 7–11)
Lab: 76 % (ref 41–77)
Lab: 88 FL (ref 80–100)

## 2017-12-16 LAB — PROSTATIC SPECIFIC ANTIGEN-PSA: Lab: 14 ng/mL — ABNORMAL HIGH (ref <6.01)

## 2017-12-16 LAB — COMPREHENSIVE METABOLIC PANEL
Lab: 139 MMOL/L (ref 137–147)
Lab: 3.8 MMOL/L (ref 3.5–5.1)

## 2017-12-17 ENCOUNTER — Encounter: Admit: 2017-12-17 | Discharge: 2017-12-17 | Payer: MEDICARE

## 2017-12-21 ENCOUNTER — Encounter: Admit: 2017-12-21 | Discharge: 2017-12-21 | Payer: MEDICARE

## 2017-12-21 DIAGNOSIS — C61 Malignant neoplasm of prostate: Principal | ICD-10-CM

## 2017-12-22 LAB — CBC AND DIFF

## 2017-12-22 LAB — PROTIME INR (PT)

## 2017-12-23 ENCOUNTER — Encounter: Admit: 2017-12-23 | Discharge: 2017-12-23 | Payer: MEDICARE

## 2017-12-23 DIAGNOSIS — C61 Malignant neoplasm of prostate: Principal | ICD-10-CM

## 2017-12-27 ENCOUNTER — Encounter: Admit: 2017-12-27 | Discharge: 2017-12-27 | Payer: MEDICARE

## 2017-12-27 DIAGNOSIS — G893 Neoplasm related pain (acute) (chronic): Principal | ICD-10-CM

## 2017-12-27 MED ORDER — MORPHINE 30 MG PO TBER
30 mg | ORAL_TABLET | ORAL | 0 refills | 7.00000 days | Status: AC
Start: 2017-12-27 — End: 2018-01-24

## 2017-12-27 MED ORDER — MORPHINE 15 MG PO TAB
ORAL_TABLET | ORAL | 0 refills | 7.00000 days | Status: AC
Start: 2017-12-27 — End: 2018-01-24

## 2017-12-28 ENCOUNTER — Encounter: Admit: 2017-12-28 | Discharge: 2017-12-28 | Payer: MEDICARE

## 2017-12-28 DIAGNOSIS — C61 Malignant neoplasm of prostate: Principal | ICD-10-CM

## 2017-12-31 ENCOUNTER — Encounter: Admit: 2017-12-31 | Discharge: 2017-12-31 | Payer: MEDICARE

## 2017-12-31 DIAGNOSIS — C61 Malignant neoplasm of prostate: Principal | ICD-10-CM

## 2017-12-31 DIAGNOSIS — C7951 Secondary malignant neoplasm of bone: ICD-10-CM

## 2017-12-31 LAB — COMPREHENSIVE METABOLIC PANEL
Lab: 0.4 mg/dL (ref 0.3–1.2)
Lab: 0.8 mg/dL (ref 0.4–1.24)
Lab: 103 MMOL/L (ref 98–110)
Lab: 11 mg/dL (ref 7–25)
Lab: 110 mg/dL — ABNORMAL HIGH (ref 70–100)
Lab: 129 U/L — ABNORMAL HIGH (ref 25–110)
Lab: 138 MMOL/L (ref 137–147)
Lab: 16 U/L (ref 7–40)
Lab: 29 MMOL/L (ref 21–30)
Lab: 3.3 g/dL — ABNORMAL LOW (ref 3.5–5.0)
Lab: 4 MMOL/L (ref 3.5–5.1)
Lab: 6 g/dL (ref 6.0–8.0)
Lab: 8.7 mg/dL (ref 8.5–10.6)
Lab: 9 U/L (ref 7–56)
Lab: 6 (ref 3–12)

## 2018-01-03 ENCOUNTER — Encounter: Admit: 2018-01-03 | Discharge: 2018-01-03 | Payer: MEDICARE

## 2018-01-03 DIAGNOSIS — M1990 Unspecified osteoarthritis, unspecified site: ICD-10-CM

## 2018-01-03 DIAGNOSIS — N210 Calculus in bladder: ICD-10-CM

## 2018-01-03 DIAGNOSIS — Z87442 Personal history of urinary calculi: Principal | ICD-10-CM

## 2018-01-03 DIAGNOSIS — H9190 Unspecified hearing loss, unspecified ear: ICD-10-CM

## 2018-01-03 DIAGNOSIS — F4310 Post-traumatic stress disorder, unspecified: ICD-10-CM

## 2018-01-03 DIAGNOSIS — M549 Dorsalgia, unspecified: ICD-10-CM

## 2018-01-03 DIAGNOSIS — M7989 Other specified soft tissue disorders: ICD-10-CM

## 2018-01-03 DIAGNOSIS — C61 Malignant neoplasm of prostate: Principal | ICD-10-CM

## 2018-01-03 DIAGNOSIS — C7951 Secondary malignant neoplasm of bone: ICD-10-CM

## 2018-01-03 DIAGNOSIS — K449 Diaphragmatic hernia without obstruction or gangrene: ICD-10-CM

## 2018-01-03 DIAGNOSIS — K319 Disease of stomach and duodenum, unspecified: ICD-10-CM

## 2018-01-03 DIAGNOSIS — K5730 Diverticulosis of large intestine without perforation or abscess without bleeding: ICD-10-CM

## 2018-01-03 DIAGNOSIS — M50321 Other cervical disc degeneration at C4-C5 level: ICD-10-CM

## 2018-01-03 DIAGNOSIS — N200 Calculus of kidney: ICD-10-CM

## 2018-01-03 MED ORDER — POTASSIUM CHLORIDE 10 MEQ PO TBTQ
10 meq | ORAL_TABLET | Freq: Every day | ORAL | 3 refills | 30.00000 days | Status: AC
Start: 2018-01-03 — End: 2018-04-22

## 2018-01-06 ENCOUNTER — Encounter: Admit: 2018-01-06 | Discharge: 2018-01-06 | Payer: MEDICARE

## 2018-01-07 ENCOUNTER — Encounter: Admit: 2018-01-07 | Discharge: 2018-01-07 | Payer: MEDICARE

## 2018-01-10 ENCOUNTER — Encounter: Admit: 2018-01-10 | Discharge: 2018-01-10 | Payer: MEDICARE

## 2018-01-10 DIAGNOSIS — C7951 Secondary malignant neoplasm of bone: ICD-10-CM

## 2018-01-10 DIAGNOSIS — Z87442 Personal history of urinary calculi: Principal | ICD-10-CM

## 2018-01-10 DIAGNOSIS — F4310 Post-traumatic stress disorder, unspecified: ICD-10-CM

## 2018-01-10 DIAGNOSIS — M549 Dorsalgia, unspecified: ICD-10-CM

## 2018-01-10 DIAGNOSIS — K319 Disease of stomach and duodenum, unspecified: ICD-10-CM

## 2018-01-10 DIAGNOSIS — C61 Malignant neoplasm of prostate: Principal | ICD-10-CM

## 2018-01-10 DIAGNOSIS — H9190 Unspecified hearing loss, unspecified ear: ICD-10-CM

## 2018-01-10 DIAGNOSIS — M1990 Unspecified osteoarthritis, unspecified site: ICD-10-CM

## 2018-01-11 DIAGNOSIS — C61 Malignant neoplasm of prostate: Principal | ICD-10-CM

## 2018-01-15 ENCOUNTER — Ambulatory Visit: Admit: 2018-01-15 | Discharge: 2018-01-15 | Payer: MEDICARE

## 2018-01-15 DIAGNOSIS — C61 Malignant neoplasm of prostate: Principal | ICD-10-CM

## 2018-01-15 DIAGNOSIS — C7951 Secondary malignant neoplasm of bone: ICD-10-CM

## 2018-01-18 ENCOUNTER — Encounter: Admit: 2018-01-18 | Discharge: 2018-01-18 | Payer: MEDICARE

## 2018-01-19 ENCOUNTER — Encounter: Admit: 2018-01-19 | Discharge: 2018-01-19 | Payer: MEDICARE

## 2018-01-19 DIAGNOSIS — C61 Malignant neoplasm of prostate: Principal | ICD-10-CM

## 2018-01-19 DIAGNOSIS — C7951 Secondary malignant neoplasm of bone: ICD-10-CM

## 2018-01-19 LAB — POC GLUCOSE: Lab: 93 mg/dL (ref 70–100)

## 2018-01-19 MED ORDER — RP DX F-18 FDG MCI
15 | Freq: Once | INTRAVENOUS | 0 refills | Status: CP
Start: 2018-01-19 — End: ?
  Administered 2018-01-19: 17:00:00 15.9 via INTRAVENOUS

## 2018-01-20 ENCOUNTER — Encounter: Admit: 2018-01-20 | Discharge: 2018-01-20 | Payer: MEDICARE

## 2018-01-20 DIAGNOSIS — C61 Malignant neoplasm of prostate: Principal | ICD-10-CM

## 2018-01-20 MED ORDER — DEXAMETHASONE 4 MG PO TAB
8 mg | ORAL_TABLET | Freq: Two times a day (BID) | ORAL | 3 refills | Status: SS
Start: 2018-01-20 — End: 2018-01-21

## 2018-01-20 MED ORDER — DEXAMETHASONE IVPB
12 mg | Freq: Once | INTRAVENOUS | 0 refills | Status: CN
Start: 2018-01-20 — End: ?

## 2018-01-20 MED ORDER — LEUPROLIDE (3 MONTH) 22.5 MG IM SYKT
22.5 mg | Freq: Once | INTRAMUSCULAR | 0 refills | Status: CN
Start: 2018-01-20 — End: ?

## 2018-01-20 MED ORDER — DOCETAXEL IVPB
55 mg/m2 | Freq: Once | INTRAVENOUS | 0 refills | Status: CN
Start: 2018-01-20 — End: ?

## 2018-01-20 MED ORDER — PREDNISONE 5 MG PO TAB
5 mg | ORAL_TABLET | Freq: Two times a day (BID) | ORAL | 11 refills | Status: SS
Start: 2018-01-20 — End: 2018-01-21

## 2018-01-21 ENCOUNTER — Encounter: Admit: 2018-01-21 | Discharge: 2018-01-21 | Payer: MEDICARE

## 2018-01-21 ENCOUNTER — Ambulatory Visit: Admit: 2018-01-21 | Discharge: 2018-01-21 | Payer: MEDICARE

## 2018-01-21 DIAGNOSIS — K319 Disease of stomach and duodenum, unspecified: ICD-10-CM

## 2018-01-21 DIAGNOSIS — Z886 Allergy status to analgesic agent status: ICD-10-CM

## 2018-01-21 DIAGNOSIS — M549 Dorsalgia, unspecified: ICD-10-CM

## 2018-01-21 DIAGNOSIS — C7951 Secondary malignant neoplasm of bone: ICD-10-CM

## 2018-01-21 DIAGNOSIS — N132 Hydronephrosis with renal and ureteral calculous obstruction: ICD-10-CM

## 2018-01-21 DIAGNOSIS — Z885 Allergy status to narcotic agent status: ICD-10-CM

## 2018-01-21 DIAGNOSIS — Z881 Allergy status to other antibiotic agents status: ICD-10-CM

## 2018-01-21 DIAGNOSIS — M1990 Unspecified osteoarthritis, unspecified site: ICD-10-CM

## 2018-01-21 DIAGNOSIS — H9190 Unspecified hearing loss, unspecified ear: ICD-10-CM

## 2018-01-21 DIAGNOSIS — Z87442 Personal history of urinary calculi: Principal | ICD-10-CM

## 2018-01-21 DIAGNOSIS — Z880 Allergy status to penicillin: ICD-10-CM

## 2018-01-21 DIAGNOSIS — C61 Malignant neoplasm of prostate: Principal | ICD-10-CM

## 2018-01-21 DIAGNOSIS — F4310 Post-traumatic stress disorder, unspecified: ICD-10-CM

## 2018-01-21 MED ORDER — MIDAZOLAM 1 MG/ML IJ SOLN
1-2 mg | Freq: Once | INTRAVENOUS | 0 refills | Status: CP
Start: 2018-01-21 — End: ?
  Administered 2018-01-21: 19:00:00 2 mg via INTRAVENOUS

## 2018-01-21 MED ORDER — MIDAZOLAM 1 MG/ML IJ SOLN
0 refills | Status: CP
  Administered 2018-01-21: 20:00:00 1 mg via INTRAVENOUS

## 2018-01-21 MED ORDER — IOPAMIDOL 61 % IV SOLN
20 mL | Freq: Once | 0 refills | Status: CP
Start: 2018-01-21 — End: ?
  Administered 2018-01-21: 20:00:00 20 mL

## 2018-01-21 MED ORDER — FENTANYL CITRATE (PF) 50 MCG/ML IJ SOLN
25-50 ug | Freq: Once | INTRAVENOUS | 0 refills | Status: CP
Start: 2018-01-21 — End: ?
  Administered 2018-01-21: 19:00:00 50 ug via INTRAVENOUS

## 2018-01-22 ENCOUNTER — Encounter: Admit: 2018-01-22 | Discharge: 2018-01-22 | Payer: MEDICARE

## 2018-01-22 ENCOUNTER — Ambulatory Visit: Admit: 2018-01-22 | Discharge: 2018-01-22 | Payer: MEDICARE

## 2018-01-22 DIAGNOSIS — M5136 Other intervertebral disc degeneration, lumbar region: ICD-10-CM

## 2018-01-22 DIAGNOSIS — C7951 Secondary malignant neoplasm of bone: ICD-10-CM

## 2018-01-22 DIAGNOSIS — F4310 Post-traumatic stress disorder, unspecified: ICD-10-CM

## 2018-01-22 DIAGNOSIS — K589 Irritable bowel syndrome without diarrhea: ICD-10-CM

## 2018-01-22 DIAGNOSIS — Z881 Allergy status to other antibiotic agents status: ICD-10-CM

## 2018-01-22 DIAGNOSIS — Z885 Allergy status to narcotic agent status: ICD-10-CM

## 2018-01-22 DIAGNOSIS — T83032A Leakage of nephrostomy catheter, initial encounter: Principal | ICD-10-CM

## 2018-01-22 DIAGNOSIS — Z87891 Personal history of nicotine dependence: ICD-10-CM

## 2018-01-22 DIAGNOSIS — Z886 Allergy status to analgesic agent status: ICD-10-CM

## 2018-01-22 DIAGNOSIS — C61 Malignant neoplasm of prostate: Secondary | ICD-10-CM

## 2018-01-22 DIAGNOSIS — Z79899 Other long term (current) drug therapy: ICD-10-CM

## 2018-01-22 DIAGNOSIS — Z880 Allergy status to penicillin: ICD-10-CM

## 2018-01-22 MED ORDER — FENTANYL CITRATE (PF) 50 MCG/ML IJ SOLN
0 refills | Status: CP
Start: 2018-01-22 — End: ?
  Administered 2018-01-22 (×3): 50 ug via INTRAVENOUS

## 2018-01-22 MED ORDER — FENTANYL CITRATE (PF) 50 MCG/ML IJ SOLN
50 ug | Freq: Once | INTRAVENOUS | 0 refills | Status: CP
Start: 2018-01-22 — End: ?
  Administered 2018-01-22: 18:00:00 50 ug via INTRAVENOUS

## 2018-01-22 MED ORDER — IOPAMIDOL 61 % IV SOLN
30 mL | Freq: Once | 0 refills | Status: CP
Start: 2018-01-22 — End: ?
  Administered 2018-01-22: 18:00:00 30 mL

## 2018-01-24 ENCOUNTER — Encounter: Admit: 2018-01-24 | Discharge: 2018-01-24 | Payer: MEDICARE

## 2018-01-24 DIAGNOSIS — G893 Neoplasm related pain (acute) (chronic): Principal | ICD-10-CM

## 2018-01-24 MED ORDER — MORPHINE 15 MG PO TAB
ORAL_TABLET | ORAL | 0 refills | 7.00000 days | Status: AC
Start: 2018-01-24 — End: 2018-02-18

## 2018-01-24 MED ORDER — MORPHINE 30 MG PO TBER
30 mg | ORAL_TABLET | ORAL | 0 refills | 7.00000 days | Status: AC
Start: 2018-01-24 — End: 2018-02-18

## 2018-01-25 ENCOUNTER — Encounter: Admit: 2018-01-25 | Discharge: 2018-01-25 | Payer: MEDICARE

## 2018-01-28 ENCOUNTER — Encounter: Admit: 2018-01-28 | Discharge: 2018-01-29 | Payer: MEDICARE

## 2018-01-28 ENCOUNTER — Encounter: Admit: 2018-01-28 | Discharge: 2018-01-28 | Payer: MEDICARE

## 2018-01-28 DIAGNOSIS — M549 Dorsalgia, unspecified: ICD-10-CM

## 2018-01-28 DIAGNOSIS — F4310 Post-traumatic stress disorder, unspecified: ICD-10-CM

## 2018-01-28 DIAGNOSIS — C61 Malignant neoplasm of prostate: Principal | ICD-10-CM

## 2018-01-28 DIAGNOSIS — Z993 Dependence on wheelchair: ICD-10-CM

## 2018-01-28 DIAGNOSIS — Z936 Other artificial openings of urinary tract status: ICD-10-CM

## 2018-01-28 DIAGNOSIS — Z87891 Personal history of nicotine dependence: ICD-10-CM

## 2018-01-28 DIAGNOSIS — C7951 Secondary malignant neoplasm of bone: ICD-10-CM

## 2018-01-28 DIAGNOSIS — Z87442 Personal history of urinary calculi: Principal | ICD-10-CM

## 2018-01-28 DIAGNOSIS — M6281 Muscle weakness (generalized): ICD-10-CM

## 2018-01-28 DIAGNOSIS — G8929 Other chronic pain: ICD-10-CM

## 2018-01-28 DIAGNOSIS — K319 Disease of stomach and duodenum, unspecified: ICD-10-CM

## 2018-01-28 DIAGNOSIS — R609 Edema, unspecified: ICD-10-CM

## 2018-01-28 DIAGNOSIS — M1990 Unspecified osteoarthritis, unspecified site: ICD-10-CM

## 2018-01-28 DIAGNOSIS — H9190 Unspecified hearing loss, unspecified ear: ICD-10-CM

## 2018-01-28 LAB — COMPREHENSIVE METABOLIC PANEL
Lab: 0.6 mg/dL — ABNORMAL LOW (ref 0.3–1.2)
Lab: 0.8 mg/dL (ref 0.4–1.24)
Lab: 103 MMOL/L — ABNORMAL LOW (ref 98–110)
Lab: 106 mg/dL — ABNORMAL HIGH (ref 70–100)
Lab: 125 U/L — ABNORMAL HIGH (ref 25–110)
Lab: 137 MMOL/L — ABNORMAL LOW (ref 137–147)
Lab: 14 mg/dL (ref 7–25)
Lab: 27 MMOL/L (ref 21–30)
Lab: 3.7 g/dL (ref 3.5–5.0)
Lab: 4 MMOL/L — ABNORMAL LOW (ref 3.5–5.1)
Lab: 6.2 g/dL (ref 6.0–8.0)
Lab: 7 10*3/uL (ref 3–12)
Lab: 8.8 mg/dL — ABNORMAL HIGH (ref 8.5–10.6)
Lab: >60 mL/min (ref >60)
Lab: >60 mL/min — ABNORMAL LOW (ref >60)

## 2018-01-28 LAB — CBC AND DIFF
Lab: 0.1 10*3/uL (ref 0–0.45)
Lab: 3.6 10*3/uL — ABNORMAL LOW (ref 4.5–11.0)

## 2018-01-28 MED ORDER — DOCETAXEL IVPB
55 mg/m2 | Freq: Once | INTRAVENOUS | 0 refills | Status: CP
Start: 2018-01-28 — End: ?
  Administered 2018-01-28 (×2): 129.8 mg via INTRAVENOUS

## 2018-01-28 MED ORDER — DEXAMETHASONE IVPB
12 mg | Freq: Once | INTRAVENOUS | 0 refills | Status: CP
Start: 2018-01-28 — End: ?
  Administered 2018-01-28 (×2): 12 mg via INTRAVENOUS

## 2018-01-31 ENCOUNTER — Encounter: Admit: 2018-01-31 | Discharge: 2018-01-31 | Payer: MEDICARE

## 2018-01-31 DIAGNOSIS — Z87442 Personal history of urinary calculi: Principal | ICD-10-CM

## 2018-01-31 DIAGNOSIS — F4310 Post-traumatic stress disorder, unspecified: ICD-10-CM

## 2018-01-31 DIAGNOSIS — K319 Disease of stomach and duodenum, unspecified: ICD-10-CM

## 2018-01-31 DIAGNOSIS — M549 Dorsalgia, unspecified: ICD-10-CM

## 2018-01-31 DIAGNOSIS — M1990 Unspecified osteoarthritis, unspecified site: ICD-10-CM

## 2018-01-31 DIAGNOSIS — H9190 Unspecified hearing loss, unspecified ear: ICD-10-CM

## 2018-01-31 DIAGNOSIS — C61 Malignant neoplasm of prostate: Principal | ICD-10-CM

## 2018-02-01 ENCOUNTER — Encounter: Admit: 2018-02-01 | Discharge: 2018-02-01 | Payer: MEDICARE

## 2018-02-01 ENCOUNTER — Ambulatory Visit: Admit: 2018-02-01 | Discharge: 2018-02-01 | Payer: MEDICARE

## 2018-02-01 DIAGNOSIS — C61 Malignant neoplasm of prostate: Principal | ICD-10-CM

## 2018-02-01 MED ORDER — CEFAZOLIN 1 GRAM IJ SOLR
2 g | Freq: Once | INTRAVENOUS | 0 refills | Status: CN
Start: 2018-02-01 — End: ?

## 2018-02-03 ENCOUNTER — Encounter: Admit: 2018-02-03 | Discharge: 2018-02-03 | Payer: MEDICARE

## 2018-02-08 ENCOUNTER — Encounter: Admit: 2018-02-08 | Discharge: 2018-02-08 | Payer: MEDICARE

## 2018-02-08 DIAGNOSIS — K319 Disease of stomach and duodenum, unspecified: ICD-10-CM

## 2018-02-08 DIAGNOSIS — R110 Nausea: ICD-10-CM

## 2018-02-08 DIAGNOSIS — T451X5A Adverse effect of antineoplastic and immunosuppressive drugs, initial encounter: ICD-10-CM

## 2018-02-08 DIAGNOSIS — C61 Malignant neoplasm of prostate: Principal | ICD-10-CM

## 2018-02-08 DIAGNOSIS — C7951 Secondary malignant neoplasm of bone: ICD-10-CM

## 2018-02-08 DIAGNOSIS — M1990 Unspecified osteoarthritis, unspecified site: ICD-10-CM

## 2018-02-08 DIAGNOSIS — Z87442 Personal history of urinary calculi: Principal | ICD-10-CM

## 2018-02-08 DIAGNOSIS — F4310 Post-traumatic stress disorder, unspecified: ICD-10-CM

## 2018-02-08 DIAGNOSIS — M549 Dorsalgia, unspecified: ICD-10-CM

## 2018-02-08 DIAGNOSIS — R339 Retention of urine, unspecified: ICD-10-CM

## 2018-02-08 DIAGNOSIS — H9190 Unspecified hearing loss, unspecified ear: ICD-10-CM

## 2018-02-08 LAB — CBC AND DIFF
Lab: 1.9 K/UL — ABNORMAL LOW (ref 4.5–11.0)
Lab: 10 g/dL — ABNORMAL LOW (ref 13.5–16.5)
Lab: 16 % — ABNORMAL HIGH (ref 11–15)
Lab: 3.4 M/UL — ABNORMAL LOW (ref 4.4–5.5)
Lab: 30 % — ABNORMAL LOW (ref 40–50)
Lab: 30 pg — ABNORMAL LOW (ref 26–34)
Lab: 33 g/dL (ref 32.0–36.0)
Lab: 89 FL — ABNORMAL LOW (ref 80–100)

## 2018-02-08 LAB — COMPREHENSIVE METABOLIC PANEL
Lab: 137 MMOL/L (ref 137–147)
Lab: 3.9 MMOL/L (ref 3.5–5.1)

## 2018-02-09 ENCOUNTER — Encounter: Admit: 2018-02-09 | Discharge: 2018-02-09 | Payer: MEDICARE

## 2018-02-10 ENCOUNTER — Encounter: Admit: 2018-02-10 | Discharge: 2018-02-10 | Payer: MEDICARE

## 2018-02-18 ENCOUNTER — Encounter: Admit: 2018-02-18 | Discharge: 2018-02-18 | Payer: MEDICARE

## 2018-02-18 ENCOUNTER — Encounter: Admit: 2018-02-18 | Discharge: 2018-02-19 | Payer: MEDICARE

## 2018-02-18 DIAGNOSIS — R112 Nausea with vomiting, unspecified: ICD-10-CM

## 2018-02-18 DIAGNOSIS — K591 Functional diarrhea: ICD-10-CM

## 2018-02-18 DIAGNOSIS — T451X5A Adverse effect of antineoplastic and immunosuppressive drugs, initial encounter: ICD-10-CM

## 2018-02-18 DIAGNOSIS — C61 Malignant neoplasm of prostate: Principal | ICD-10-CM

## 2018-02-18 DIAGNOSIS — H9190 Unspecified hearing loss, unspecified ear: ICD-10-CM

## 2018-02-18 DIAGNOSIS — K319 Disease of stomach and duodenum, unspecified: ICD-10-CM

## 2018-02-18 DIAGNOSIS — F4310 Post-traumatic stress disorder, unspecified: ICD-10-CM

## 2018-02-18 DIAGNOSIS — Z936 Other artificial openings of urinary tract status: ICD-10-CM

## 2018-02-18 DIAGNOSIS — D701 Agranulocytosis secondary to cancer chemotherapy: ICD-10-CM

## 2018-02-18 DIAGNOSIS — R5381 Other malaise: ICD-10-CM

## 2018-02-18 DIAGNOSIS — R600 Localized edema: ICD-10-CM

## 2018-02-18 DIAGNOSIS — C7951 Secondary malignant neoplasm of bone: ICD-10-CM

## 2018-02-18 DIAGNOSIS — R52 Pain, unspecified: ICD-10-CM

## 2018-02-18 DIAGNOSIS — G893 Neoplasm related pain (acute) (chronic): ICD-10-CM

## 2018-02-18 DIAGNOSIS — M1990 Unspecified osteoarthritis, unspecified site: ICD-10-CM

## 2018-02-18 DIAGNOSIS — Z87442 Personal history of urinary calculi: Principal | ICD-10-CM

## 2018-02-18 DIAGNOSIS — M549 Dorsalgia, unspecified: ICD-10-CM

## 2018-02-18 LAB — COMPREHENSIVE METABOLIC PANEL
Lab: 107 U/L (ref 25–110)
Lab: 26 U/L — ABNORMAL LOW (ref 7–40)
Lab: 3.6 g/dL (ref 3.5–5.0)
Lab: 30 MMOL/L — ABNORMAL HIGH (ref 21–30)
Lab: 5 % (ref 3–12)
Lab: 7 U/L (ref 7–56)
Lab: >60 mL/min (ref >60)
Lab: >60 mL/min (ref >60)

## 2018-02-18 LAB — CBC AND DIFF: Lab: 4.3 10*3/uL (ref 1.8–7.0)

## 2018-02-18 MED ORDER — DOCETAXEL IVPB
55 mg/m2 | Freq: Once | INTRAVENOUS | 0 refills | Status: CP
Start: 2018-02-18 — End: ?
  Administered 2018-02-18 (×2): 129.8 mg via INTRAVENOUS

## 2018-02-18 MED ORDER — MORPHINE 15 MG PO TAB
ORAL_TABLET | ORAL | 0 refills | 7.00000 days | Status: AC
Start: 2018-02-18 — End: 2018-03-11

## 2018-02-18 MED ORDER — DEXAMETHASONE IVPB
12 mg | Freq: Once | INTRAVENOUS | 0 refills | Status: CP
Start: 2018-02-18 — End: ?
  Administered 2018-02-18 (×2): 12 mg via INTRAVENOUS

## 2018-02-18 MED ORDER — MORPHINE 30 MG PO TBER
30 mg | ORAL_TABLET | ORAL | 0 refills | 7.00000 days | Status: AC
Start: 2018-02-18 — End: 2018-03-11

## 2018-02-22 ENCOUNTER — Encounter: Admit: 2018-02-22 | Discharge: 2018-02-22 | Payer: MEDICARE

## 2018-02-22 DIAGNOSIS — C61 Malignant neoplasm of prostate: Principal | ICD-10-CM

## 2018-02-23 ENCOUNTER — Encounter: Admit: 2018-02-23 | Discharge: 2018-02-23 | Payer: MEDICARE

## 2018-02-25 ENCOUNTER — Encounter: Admit: 2018-02-25 | Discharge: 2018-02-25 | Payer: MEDICARE

## 2018-03-01 ENCOUNTER — Encounter: Admit: 2018-03-01 | Discharge: 2018-03-01 | Payer: MEDICARE

## 2018-03-11 ENCOUNTER — Encounter: Admit: 2018-03-11 | Discharge: 2018-03-12 | Payer: MEDICARE

## 2018-03-11 ENCOUNTER — Encounter: Admit: 2018-03-11 | Discharge: 2018-03-11 | Payer: MEDICARE

## 2018-03-11 DIAGNOSIS — M549 Dorsalgia, unspecified: ICD-10-CM

## 2018-03-11 DIAGNOSIS — K5903 Drug induced constipation: ICD-10-CM

## 2018-03-11 DIAGNOSIS — C61 Malignant neoplasm of prostate: Principal | ICD-10-CM

## 2018-03-11 DIAGNOSIS — R112 Nausea with vomiting, unspecified: ICD-10-CM

## 2018-03-11 DIAGNOSIS — K319 Disease of stomach and duodenum, unspecified: ICD-10-CM

## 2018-03-11 DIAGNOSIS — R339 Retention of urine, unspecified: ICD-10-CM

## 2018-03-11 DIAGNOSIS — T451X5A Adverse effect of antineoplastic and immunosuppressive drugs, initial encounter: ICD-10-CM

## 2018-03-11 DIAGNOSIS — C7951 Secondary malignant neoplasm of bone: ICD-10-CM

## 2018-03-11 DIAGNOSIS — M1990 Unspecified osteoarthritis, unspecified site: ICD-10-CM

## 2018-03-11 DIAGNOSIS — G893 Neoplasm related pain (acute) (chronic): ICD-10-CM

## 2018-03-11 DIAGNOSIS — F4310 Post-traumatic stress disorder, unspecified: ICD-10-CM

## 2018-03-11 DIAGNOSIS — H9190 Unspecified hearing loss, unspecified ear: ICD-10-CM

## 2018-03-11 DIAGNOSIS — Z87442 Personal history of urinary calculi: Principal | ICD-10-CM

## 2018-03-11 LAB — CBC AND DIFF
Lab: 6.8 10*3/uL (ref 1.8–7.0)
Lab: 8.8 10*3/uL (ref 4.5–11.0)

## 2018-03-11 MED ORDER — KETOTIFEN FUMARATE 0.025 % (0.035 %) OP DROP
1 [drp] | Freq: Two times a day (BID) | OPHTHALMIC | 3 refills | Status: AC
Start: 2018-03-11 — End: ?

## 2018-03-11 MED ORDER — HEPARIN, PORCINE (PF) 100 UNIT/ML IV SYRG
500 [IU] | Freq: Once | 0 refills | Status: DC
Start: 2018-03-11 — End: 2018-03-16

## 2018-03-11 MED ORDER — DIPHENOXYLATE-ATROPINE 2.5-0.025 MG PO TAB
1 | ORAL_TABLET | Freq: Four times a day (QID) | ORAL | 3 refills | 18.00000 days | Status: AC | PRN
Start: 2018-03-11 — End: ?

## 2018-03-11 MED ORDER — FUROSEMIDE 40 MG PO TAB
20 mg | ORAL_TABLET | Freq: Every morning | ORAL | 3 refills | 90.00000 days | Status: AC
Start: 2018-03-11 — End: 2018-04-22

## 2018-03-11 MED ORDER — MORPHINE 15 MG PO TAB
ORAL_TABLET | ORAL | 0 refills | 7.00000 days | Status: AC
Start: 2018-03-11 — End: 2018-04-18

## 2018-03-11 MED ORDER — DOCETAXEL IVPB
55 mg/m2 | Freq: Once | INTRAVENOUS | 0 refills | Status: CP
Start: 2018-03-11 — End: ?
  Administered 2018-03-11 (×2): 129.8 mg via INTRAVENOUS

## 2018-03-11 MED ORDER — MORPHINE 30 MG PO TBER
30 mg | ORAL_TABLET | ORAL | 0 refills | 7.00000 days | Status: AC
Start: 2018-03-11 — End: 2018-04-18

## 2018-03-11 MED ORDER — DEXAMETHASONE 4 MG PO TAB
8 mg | ORAL_TABLET | Freq: Every day | ORAL | 3 refills | 12.50000 days | Status: AC
Start: 2018-03-11 — End: 2018-04-01

## 2018-03-11 MED ORDER — DEXAMETHASONE IVPB
12 mg | Freq: Once | INTRAVENOUS | 0 refills | Status: CP
Start: 2018-03-11 — End: ?
  Administered 2018-03-11 (×2): 12 mg via INTRAVENOUS

## 2018-03-12 LAB — COMPREHENSIVE METABOLIC PANEL
Lab: 0.7 mg/dL (ref 0.4–1.24)
Lab: 10 % (ref 3–12)
Lab: 101 MMOL/L — ABNORMAL LOW (ref 98–110)
Lab: 11 mg/dL (ref 7–25)
Lab: 138 MMOL/L — ABNORMAL LOW (ref 137–147)
Lab: 27 MMOL/L — ABNORMAL LOW (ref 21–30)
Lab: 3.8 MMOL/L — ABNORMAL LOW (ref 3.5–5.1)
Lab: 35 U/L (ref 7–40)
Lab: 7 U/L (ref 7–56)
Lab: 98 mg/dL (ref 70–100)
Lab: >60 mL/min (ref >60)
Lab: >60 mL/min (ref >60)

## 2018-03-21 ENCOUNTER — Encounter: Admit: 2018-03-21 | Discharge: 2018-03-21 | Payer: MEDICARE

## 2018-03-21 ENCOUNTER — Encounter: Admit: 2018-03-21 | Discharge: 2018-03-22 | Payer: MEDICARE

## 2018-03-21 DIAGNOSIS — C7951 Secondary malignant neoplasm of bone: ICD-10-CM

## 2018-03-21 DIAGNOSIS — C61 Malignant neoplasm of prostate: Principal | ICD-10-CM

## 2018-03-21 LAB — POC GLUCOSE: Lab: 112 mg/dL — ABNORMAL HIGH (ref 70–100)

## 2018-03-21 MED ORDER — LEUPROLIDE (3 MONTH) 22.5 MG IM SYKT
22.5 mg | Freq: Once | INTRAMUSCULAR | 0 refills | Status: CP
Start: 2018-03-21 — End: ?
  Administered 2018-03-21: 21:00:00 22.5 mg via INTRAMUSCULAR

## 2018-03-21 MED ORDER — RP DX F-18 FDG MCI
15 | Freq: Once | INTRAVENOUS | 0 refills | Status: CP
Start: 2018-03-21 — End: ?
  Administered 2018-03-21: 19:00:00 17.8 via INTRAVENOUS

## 2018-03-24 ENCOUNTER — Encounter: Admit: 2018-03-24 | Discharge: 2018-03-24 | Payer: MEDICARE

## 2018-03-24 DIAGNOSIS — C61 Malignant neoplasm of prostate: Principal | ICD-10-CM

## 2018-03-24 MED ORDER — ENZALUTAMIDE 40 MG PO CAP
160 mg | ORAL_CAPSULE | Freq: Every day | ORAL | 3 refills | Status: AC
Start: 2018-03-24 — End: 2018-04-01

## 2018-03-24 MED ORDER — ENZALUTAMIDE 40 MG PO CAP
160 mg | ORAL_CAPSULE | Freq: Every day | ORAL | 3 refills | Status: DC
Start: 2018-03-24 — End: 2018-03-24

## 2018-03-28 ENCOUNTER — Encounter: Admit: 2018-03-28 | Discharge: 2018-03-28 | Payer: MEDICARE

## 2018-04-01 ENCOUNTER — Encounter: Admit: 2018-04-01 | Discharge: 2018-04-01 | Payer: MEDICARE

## 2018-04-01 DIAGNOSIS — K591 Functional diarrhea: ICD-10-CM

## 2018-04-01 DIAGNOSIS — Z87442 Personal history of urinary calculi: Principal | ICD-10-CM

## 2018-04-01 DIAGNOSIS — R110 Nausea: ICD-10-CM

## 2018-04-01 DIAGNOSIS — C61 Malignant neoplasm of prostate: Principal | ICD-10-CM

## 2018-04-01 DIAGNOSIS — F4310 Post-traumatic stress disorder, unspecified: ICD-10-CM

## 2018-04-01 DIAGNOSIS — C7951 Secondary malignant neoplasm of bone: ICD-10-CM

## 2018-04-01 DIAGNOSIS — M549 Dorsalgia, unspecified: ICD-10-CM

## 2018-04-01 DIAGNOSIS — M1990 Unspecified osteoarthritis, unspecified site: ICD-10-CM

## 2018-04-01 DIAGNOSIS — H9190 Unspecified hearing loss, unspecified ear: ICD-10-CM

## 2018-04-01 DIAGNOSIS — K319 Disease of stomach and duodenum, unspecified: ICD-10-CM

## 2018-04-01 MED ORDER — ENZALUTAMIDE 40 MG PO CAP
160 mg | ORAL_CAPSULE | Freq: Every day | ORAL | 3 refills | Status: AC
Start: 2018-04-01 — End: 2018-04-12

## 2018-04-11 ENCOUNTER — Encounter: Admit: 2018-04-11 | Discharge: 2018-04-11 | Payer: MEDICARE

## 2018-04-11 DIAGNOSIS — H9190 Unspecified hearing loss, unspecified ear: ICD-10-CM

## 2018-04-11 DIAGNOSIS — Z87442 Personal history of urinary calculi: Principal | ICD-10-CM

## 2018-04-11 DIAGNOSIS — K319 Disease of stomach and duodenum, unspecified: ICD-10-CM

## 2018-04-11 DIAGNOSIS — C61 Malignant neoplasm of prostate: Principal | ICD-10-CM

## 2018-04-11 DIAGNOSIS — F4310 Post-traumatic stress disorder, unspecified: ICD-10-CM

## 2018-04-11 DIAGNOSIS — M549 Dorsalgia, unspecified: ICD-10-CM

## 2018-04-11 DIAGNOSIS — M1990 Unspecified osteoarthritis, unspecified site: ICD-10-CM

## 2018-04-12 ENCOUNTER — Encounter: Admit: 2018-04-12 | Discharge: 2018-04-12 | Payer: MEDICARE

## 2018-04-12 DIAGNOSIS — C61 Malignant neoplasm of prostate: Principal | ICD-10-CM

## 2018-04-12 MED ORDER — ENZALUTAMIDE 40 MG PO CAP
160 mg | ORAL_CAPSULE | Freq: Every day | ORAL | 0 refills | Status: CN
Start: 2018-04-12 — End: ?

## 2018-04-12 MED ORDER — ENZALUTAMIDE 40 MG PO CAP
160 mg | ORAL_CAPSULE | Freq: Every day | ORAL | 0 refills | Status: AC
Start: 2018-04-12 — End: 2018-04-22
  Filled 2018-04-12 (×2): qty 120, 30d supply, fill #1

## 2018-04-12 MED ORDER — ENZALUTAMIDE 40 MG PO CAP
160 mg | ORAL_CAPSULE | Freq: Every day | ORAL | 3 refills | Status: AC
Start: 2018-04-12 — End: 2018-05-09

## 2018-04-17 ENCOUNTER — Encounter: Admit: 2018-04-17 | Discharge: 2018-04-17 | Payer: MEDICARE

## 2018-04-18 ENCOUNTER — Encounter: Admit: 2018-04-18 | Discharge: 2018-04-18 | Payer: MEDICARE

## 2018-04-18 DIAGNOSIS — G893 Neoplasm related pain (acute) (chronic): Principal | ICD-10-CM

## 2018-04-18 MED ORDER — MORPHINE 30 MG PO TBER
30 mg | ORAL_TABLET | ORAL | 0 refills | 7.00000 days | Status: AC
Start: 2018-04-18 — End: 2018-05-06

## 2018-04-18 MED ORDER — MORPHINE 15 MG PO TAB
ORAL_TABLET | ORAL | 0 refills | 7.00000 days | Status: AC
Start: 2018-04-18 — End: 2018-05-06

## 2018-04-22 ENCOUNTER — Encounter: Admit: 2018-04-22 | Discharge: 2018-04-22 | Payer: MEDICARE

## 2018-04-22 DIAGNOSIS — C61 Malignant neoplasm of prostate: Principal | ICD-10-CM

## 2018-04-22 DIAGNOSIS — K319 Disease of stomach and duodenum, unspecified: ICD-10-CM

## 2018-04-22 DIAGNOSIS — D649 Anemia, unspecified: Secondary | ICD-10-CM

## 2018-04-22 DIAGNOSIS — F4310 Post-traumatic stress disorder, unspecified: ICD-10-CM

## 2018-04-22 DIAGNOSIS — C7951 Secondary malignant neoplasm of bone: ICD-10-CM

## 2018-04-22 DIAGNOSIS — Z87442 Personal history of urinary calculi: Principal | ICD-10-CM

## 2018-04-22 DIAGNOSIS — R339 Retention of urine, unspecified: ICD-10-CM

## 2018-04-22 DIAGNOSIS — M549 Dorsalgia, unspecified: ICD-10-CM

## 2018-04-22 DIAGNOSIS — H9190 Unspecified hearing loss, unspecified ear: ICD-10-CM

## 2018-04-22 DIAGNOSIS — M1990 Unspecified osteoarthritis, unspecified site: ICD-10-CM

## 2018-04-22 LAB — CBC AND DIFF
Lab: 0 10*3/uL (ref 0–0.20)
Lab: 3 M/UL — ABNORMAL LOW (ref 4.4–5.5)
Lab: 5.4 10*3/uL (ref 4.5–11.0)

## 2018-04-22 MED ORDER — POTASSIUM CHLORIDE 10 MEQ PO TBTQ
10 meq | ORAL_TABLET | Freq: Every day | ORAL | 3 refills | 30.00000 days | Status: AC
Start: 2018-04-22 — End: ?

## 2018-04-22 MED ORDER — ENZALUTAMIDE 40 MG PO CAP
160 mg | ORAL_CAPSULE | Freq: Every day | ORAL | 3 refills | Status: AC
Start: 2018-04-22 — End: 2018-05-06

## 2018-04-22 MED ORDER — FUROSEMIDE 20 MG PO TAB
20 mg | ORAL_TABLET | Freq: Every morning | ORAL | 3 refills | 90.00000 days | Status: AC
Start: 2018-04-22 — End: 2018-06-17

## 2018-04-23 LAB — COMPREHENSIVE METABOLIC PANEL
Lab: 0.6 mg/dL (ref 0.3–1.2)
Lab: 0.8 mg/dL — ABNORMAL HIGH (ref 0.4–1.24)
Lab: 10 K/UL — ABNORMAL LOW (ref 3–12)
Lab: 10 U/L (ref 7–56)
Lab: 102 MMOL/L (ref 98–110)
Lab: 131 mg/dL — ABNORMAL HIGH (ref 70–100)
Lab: 138 MMOL/L — ABNORMAL LOW (ref 137–147)
Lab: 14 mg/dL (ref 7–25)
Lab: 186 U/L — ABNORMAL HIGH (ref 25–110)
Lab: 26 MMOL/L (ref 21–30)
Lab: 27 U/L — ABNORMAL HIGH (ref 7–40)
Lab: 3.6 g/dL — ABNORMAL LOW (ref 3.5–5.0)
Lab: 4.1 MMOL/L — ABNORMAL LOW (ref 3.5–5.1)
Lab: 6.1 g/dL (ref 6.0–8.0)
Lab: 9.1 mg/dL (ref 8.5–10.6)
Lab: >60 mL/min (ref >60)
Lab: >60 mL/min (ref >60)

## 2018-04-23 LAB — VITAMIN B12: Lab: 153 pg/mL — ABNORMAL LOW (ref 180–914)

## 2018-04-23 LAB — FOLATE, SERUM: Lab: 4 ng/mL (ref >3.9)

## 2018-04-23 LAB — IRON + BINDING CAPACITY + %SAT+ FERRITIN
Lab: 15 % — ABNORMAL LOW (ref 28–42)
Lab: 261 ug/dL — ABNORMAL LOW (ref 270–380)
Lab: 40 ug/dL — ABNORMAL LOW (ref 50–185)

## 2018-04-23 LAB — PROSTATIC SPECIFIC ANTIGEN-PSA: Lab: 20 ng/mL — ABNORMAL HIGH (ref <6.01)

## 2018-04-25 ENCOUNTER — Encounter: Admit: 2018-04-25 | Discharge: 2018-04-25 | Payer: MEDICARE

## 2018-04-26 ENCOUNTER — Encounter: Admit: 2018-04-26 | Discharge: 2018-04-26 | Payer: MEDICARE

## 2018-04-29 ENCOUNTER — Encounter: Admit: 2018-04-29 | Discharge: 2018-04-29 | Payer: MEDICARE

## 2018-05-02 ENCOUNTER — Encounter: Admit: 2018-05-02 | Discharge: 2018-05-02 | Payer: MEDICARE

## 2018-05-02 ENCOUNTER — Ambulatory Visit: Admit: 2018-05-02 | Discharge: 2018-05-02 | Payer: MEDICARE

## 2018-05-02 DIAGNOSIS — C259 Malignant neoplasm of pancreas, unspecified: ICD-10-CM

## 2018-05-02 DIAGNOSIS — Z4682 Encounter for fitting and adjustment of non-vascular catheter: ICD-10-CM

## 2018-05-02 DIAGNOSIS — Z79899 Other long term (current) drug therapy: ICD-10-CM

## 2018-05-02 DIAGNOSIS — N320 Bladder-neck obstruction: Principal | ICD-10-CM

## 2018-05-02 DIAGNOSIS — Z885 Allergy status to narcotic agent status: ICD-10-CM

## 2018-05-02 DIAGNOSIS — C7951 Secondary malignant neoplasm of bone: ICD-10-CM

## 2018-05-02 DIAGNOSIS — Z791 Long term (current) use of non-steroidal anti-inflammatories (NSAID): ICD-10-CM

## 2018-05-02 DIAGNOSIS — Z936 Other artificial openings of urinary tract status: ICD-10-CM

## 2018-05-02 DIAGNOSIS — Z880 Allergy status to penicillin: ICD-10-CM

## 2018-05-02 DIAGNOSIS — Z886 Allergy status to analgesic agent status: ICD-10-CM

## 2018-05-02 DIAGNOSIS — C61 Malignant neoplasm of prostate: ICD-10-CM

## 2018-05-02 DIAGNOSIS — F4310 Post-traumatic stress disorder, unspecified: ICD-10-CM

## 2018-05-02 DIAGNOSIS — Z881 Allergy status to other antibiotic agents status: ICD-10-CM

## 2018-05-02 DIAGNOSIS — Z87891 Personal history of nicotine dependence: ICD-10-CM

## 2018-05-02 DIAGNOSIS — T451X5A Adverse effect of antineoplastic and immunosuppressive drugs, initial encounter: ICD-10-CM

## 2018-05-02 DIAGNOSIS — M1990 Unspecified osteoarthritis, unspecified site: ICD-10-CM

## 2018-05-02 MED ORDER — FENTANYL CITRATE (PF) 50 MCG/ML IJ SOLN
0 refills | Status: CP
Start: 2018-05-02 — End: ?
  Administered 2018-05-02: 20:00:00 50 ug via INTRAVENOUS

## 2018-05-02 MED ORDER — FENTANYL CITRATE (PF) 50 MCG/ML IJ SOLN
25-50 ug | Freq: Once | INTRAVENOUS | 0 refills | Status: CP
Start: 2018-05-02 — End: ?

## 2018-05-02 MED ORDER — SODIUM CHLORIDE 0.9 % IV SOLP
0 refills | Status: CP
Start: 2018-05-02 — End: ?
  Administered 2018-05-02: 20:00:00 100 mL via INTRAVENOUS

## 2018-05-02 MED ORDER — IOPAMIDOL 61 % IV SOLN
20 mL | Freq: Once | URETHRAL | 0 refills | Status: CP
Start: 2018-05-02 — End: ?
  Administered 2018-05-02: 21:00:00 20 mL via URETHRAL

## 2018-05-02 MED ORDER — MIDAZOLAM 1 MG/ML IJ SOLN
1-2 mg | Freq: Once | INTRAVENOUS | 0 refills | Status: CP
Start: 2018-05-02 — End: ?
  Administered 2018-05-02: 20:00:00 1 mg via INTRAVENOUS

## 2018-05-04 ENCOUNTER — Encounter: Admit: 2018-05-04 | Discharge: 2018-05-04 | Payer: MEDICARE

## 2018-05-05 ENCOUNTER — Encounter: Admit: 2018-05-05 | Discharge: 2018-05-05 | Payer: MEDICARE

## 2018-05-05 DIAGNOSIS — C61 Malignant neoplasm of prostate: Principal | ICD-10-CM

## 2018-05-05 MED ORDER — ENZALUTAMIDE 40 MG PO CAP
160 mg | ORAL_CAPSULE | Freq: Every day | ORAL | 0 refills
Start: 2018-05-05 — End: ?

## 2018-05-06 ENCOUNTER — Encounter: Admit: 2018-05-06 | Discharge: 2018-05-06 | Payer: MEDICARE

## 2018-05-06 DIAGNOSIS — R110 Nausea: ICD-10-CM

## 2018-05-06 DIAGNOSIS — M549 Dorsalgia, unspecified: ICD-10-CM

## 2018-05-06 DIAGNOSIS — H9190 Unspecified hearing loss, unspecified ear: ICD-10-CM

## 2018-05-06 DIAGNOSIS — Z87891 Personal history of nicotine dependence: ICD-10-CM

## 2018-05-06 DIAGNOSIS — K591 Functional diarrhea: ICD-10-CM

## 2018-05-06 DIAGNOSIS — G893 Neoplasm related pain (acute) (chronic): Principal | ICD-10-CM

## 2018-05-06 DIAGNOSIS — F4310 Post-traumatic stress disorder, unspecified: ICD-10-CM

## 2018-05-06 DIAGNOSIS — C61 Malignant neoplasm of prostate: Principal | ICD-10-CM

## 2018-05-06 DIAGNOSIS — M1990 Unspecified osteoarthritis, unspecified site: ICD-10-CM

## 2018-05-06 DIAGNOSIS — C7951 Secondary malignant neoplasm of bone: ICD-10-CM

## 2018-05-06 DIAGNOSIS — K319 Disease of stomach and duodenum, unspecified: ICD-10-CM

## 2018-05-06 DIAGNOSIS — Z87442 Personal history of urinary calculi: Principal | ICD-10-CM

## 2018-05-06 LAB — CBC AND DIFF
Lab: 0 10*3/uL (ref 0–0.20)
Lab: 0.3 10*3/uL (ref 0–0.45)
Lab: 0.5 10*3/uL (ref 0–0.80)
Lab: 0.8 10*3/uL — ABNORMAL LOW (ref 1.0–4.8)
Lab: 1 % (ref >60)
Lab: 10 % (ref 4–12)
Lab: 16 % — ABNORMAL HIGH (ref 11–15)
Lab: 16 % — ABNORMAL LOW (ref 24–44)
Lab: 2.8 M/UL — ABNORMAL LOW (ref 4.4–5.5)
Lab: 26 % — ABNORMAL LOW (ref 40–50)
Lab: 3.4 10*3/uL (ref 1.8–7.0)
Lab: 31 pg — ABNORMAL LOW (ref >50)
Lab: 33 g/dL — ABNORMAL HIGH (ref <150)
Lab: 365 10*3/uL — ABNORMAL HIGH (ref >=60)
Lab: 5 % (ref >60)
Lab: 5 K/UL (ref 4.5–11.0)
Lab: 6.8 FL — ABNORMAL LOW (ref >=60)
Lab: 68 % — ABNORMAL HIGH (ref 41–77)
Lab: 9 g/dL — ABNORMAL LOW (ref 13.5–16.5)
Lab: 92 FL (ref <200)

## 2018-05-06 MED ORDER — MORPHINE 15 MG PO TAB
ORAL_TABLET | ORAL | 0 refills | 7.00000 days | Status: AC
Start: 2018-05-06 — End: 2018-06-09

## 2018-05-06 MED ORDER — CYANOCOBALAMIN (VITAMIN B-12) 1,000 MCG PO TAB
1000 ug | ORAL_TABLET | Freq: Every day | ORAL | 3 refills | 29.00000 days | Status: AC
Start: 2018-05-06 — End: 2018-06-23

## 2018-05-06 MED ORDER — FOLIC ACID 1 MG PO TAB
1 mg | ORAL_TABLET | Freq: Every day | ORAL | 3 refills | Status: AC
Start: 2018-05-06 — End: 2018-06-23

## 2018-05-06 MED ORDER — METHYLPHENIDATE HCL 5 MG PO TAB
5 mg | ORAL_TABLET | Freq: Two times a day (BID) | ORAL | 0 refills | Status: AC
Start: 2018-05-06 — End: ?

## 2018-05-06 MED ORDER — MORPHINE 30 MG PO TBER
30 mg | ORAL_TABLET | ORAL | 0 refills | 7.00000 days | Status: AC
Start: 2018-05-06 — End: 2018-06-09

## 2018-05-07 LAB — IRON + BINDING CAPACITY + %SAT+ FERRITIN
Lab: 16 % — ABNORMAL LOW (ref 28–42)
Lab: 240 ug/dL — ABNORMAL LOW (ref 270–380)
Lab: 39 ug/dL — ABNORMAL LOW (ref 50–185)
Lab: 602 ng/mL — ABNORMAL HIGH (ref 30–300)

## 2018-05-07 LAB — COMPREHENSIVE METABOLIC PANEL
Lab: 140 MMOL/L (ref 137–147)
Lab: 3.8 MMOL/L (ref 3.5–5.1)

## 2018-05-07 LAB — PROSTATIC SPECIFIC ANTIGEN-PSA: Lab: 17 ng/mL — ABNORMAL HIGH (ref <6.01)

## 2018-05-09 ENCOUNTER — Encounter: Admit: 2018-05-09 | Discharge: 2018-05-09 | Payer: MEDICARE

## 2018-05-09 DIAGNOSIS — C61 Malignant neoplasm of prostate: Principal | ICD-10-CM

## 2018-05-09 MED ORDER — ENZALUTAMIDE 40 MG PO CAP
120 mg | ORAL_CAPSULE | Freq: Every day | ORAL | 3 refills | Status: AC
Start: 2018-05-09 — End: 2018-05-09

## 2018-05-09 MED ORDER — ENZALUTAMIDE 40 MG PO CAP
120 mg | ORAL_CAPSULE | Freq: Every day | ORAL | 3 refills | Status: AC
Start: 2018-05-09 — End: 2018-06-20

## 2018-05-30 ENCOUNTER — Encounter: Admit: 2018-05-30 | Discharge: 2018-05-30 | Payer: MEDICARE

## 2018-05-31 ENCOUNTER — Encounter: Admit: 2018-05-31 | Discharge: 2018-05-31 | Payer: MEDICARE

## 2018-06-08 ENCOUNTER — Encounter: Admit: 2018-06-08 | Discharge: 2018-06-08 | Payer: MEDICARE

## 2018-06-08 DIAGNOSIS — C61 Malignant neoplasm of prostate: Secondary | ICD-10-CM

## 2018-06-09 ENCOUNTER — Encounter: Admit: 2018-06-09 | Discharge: 2018-06-09 | Payer: MEDICARE

## 2018-06-09 DIAGNOSIS — K591 Functional diarrhea: Secondary | ICD-10-CM

## 2018-06-09 DIAGNOSIS — F4310 Post-traumatic stress disorder, unspecified: Secondary | ICD-10-CM

## 2018-06-09 DIAGNOSIS — C7951 Secondary malignant neoplasm of bone: Secondary | ICD-10-CM

## 2018-06-09 DIAGNOSIS — T451X5A Adverse effect of antineoplastic and immunosuppressive drugs, initial encounter: Secondary | ICD-10-CM

## 2018-06-09 DIAGNOSIS — R112 Nausea with vomiting, unspecified: Secondary | ICD-10-CM

## 2018-06-09 DIAGNOSIS — M549 Dorsalgia, unspecified: Secondary | ICD-10-CM

## 2018-06-09 DIAGNOSIS — R600 Localized edema: Secondary | ICD-10-CM

## 2018-06-09 DIAGNOSIS — C61 Malignant neoplasm of prostate: Secondary | ICD-10-CM

## 2018-06-09 DIAGNOSIS — R52 Pain, unspecified: Secondary | ICD-10-CM

## 2018-06-09 DIAGNOSIS — K319 Disease of stomach and duodenum, unspecified: Secondary | ICD-10-CM

## 2018-06-09 DIAGNOSIS — G893 Neoplasm related pain (acute) (chronic): Secondary | ICD-10-CM

## 2018-06-09 DIAGNOSIS — H9190 Unspecified hearing loss, unspecified ear: Secondary | ICD-10-CM

## 2018-06-09 DIAGNOSIS — M1990 Unspecified osteoarthritis, unspecified site: Secondary | ICD-10-CM

## 2018-06-09 DIAGNOSIS — R110 Nausea: Secondary | ICD-10-CM

## 2018-06-09 DIAGNOSIS — Z87442 Personal history of urinary calculi: Secondary | ICD-10-CM

## 2018-06-09 MED ORDER — MORPHINE 15 MG PO TAB
ORAL_TABLET | ORAL | 0 refills | 7.00000 days | Status: AC
Start: 2018-06-09 — End: 2018-07-11

## 2018-06-09 MED ORDER — MORPHINE 30 MG PO TBER
30 mg | ORAL_TABLET | ORAL | 0 refills | 7.00000 days | Status: AC
Start: 2018-06-09 — End: 2018-07-11

## 2018-06-13 ENCOUNTER — Encounter: Admit: 2018-06-13 | Discharge: 2018-06-26 | Payer: MEDICARE

## 2018-06-13 ENCOUNTER — Ambulatory Visit: Admit: 2018-06-13 | Discharge: 2018-06-14 | Payer: MEDICARE

## 2018-06-13 ENCOUNTER — Encounter: Admit: 2018-06-13 | Discharge: 2018-06-13 | Payer: MEDICARE

## 2018-06-13 LAB — POC CREATININE, RAD: Lab: 0.7 mg/dL (ref 0.4–1.24)

## 2018-06-13 MED ORDER — SODIUM CHLORIDE 0.9 % IJ SOLN
50 mL | Freq: Once | INTRAVENOUS | 0 refills | Status: CP
Start: 2018-06-13 — End: ?
  Administered 2018-06-13: 20:00:00 50 mL via INTRAVENOUS

## 2018-06-13 MED ORDER — RP DX TC-99M MEDRONATE MCI
25 | Freq: Once | INTRAVENOUS | 0 refills | Status: CP
Start: 2018-06-13 — End: ?
  Administered 2018-06-13: 18:00:00 26.1 via INTRAVENOUS

## 2018-06-13 MED ORDER — IOHEXOL 350 MG IODINE/ML IV SOLN
100 mL | Freq: Once | INTRAVENOUS | 0 refills | Status: CP
Start: 2018-06-13 — End: ?
  Administered 2018-06-13: 20:00:00 100 mL via INTRAVENOUS

## 2018-06-17 ENCOUNTER — Encounter: Admit: 2018-06-17 | Discharge: 2018-06-17 | Payer: MEDICARE

## 2018-06-17 ENCOUNTER — Encounter: Admit: 2018-06-17 | Discharge: 2018-06-18 | Payer: MEDICARE

## 2018-06-17 DIAGNOSIS — Z923 Personal history of irradiation: Secondary | ICD-10-CM

## 2018-06-17 DIAGNOSIS — C7951 Secondary malignant neoplasm of bone: Secondary | ICD-10-CM

## 2018-06-17 DIAGNOSIS — Z9049 Acquired absence of other specified parts of digestive tract: Secondary | ICD-10-CM

## 2018-06-17 DIAGNOSIS — Z87442 Personal history of urinary calculi: Secondary | ICD-10-CM

## 2018-06-17 DIAGNOSIS — M1990 Unspecified osteoarthritis, unspecified site: Secondary | ICD-10-CM

## 2018-06-17 DIAGNOSIS — M549 Dorsalgia, unspecified: Secondary | ICD-10-CM

## 2018-06-17 DIAGNOSIS — G8929 Other chronic pain: Secondary | ICD-10-CM

## 2018-06-17 DIAGNOSIS — C61 Malignant neoplasm of prostate: Secondary | ICD-10-CM

## 2018-06-17 DIAGNOSIS — F4310 Post-traumatic stress disorder, unspecified: Secondary | ICD-10-CM

## 2018-06-17 DIAGNOSIS — H9190 Unspecified hearing loss, unspecified ear: Secondary | ICD-10-CM

## 2018-06-17 DIAGNOSIS — R609 Edema, unspecified: Secondary | ICD-10-CM

## 2018-06-17 DIAGNOSIS — K319 Disease of stomach and duodenum, unspecified: Secondary | ICD-10-CM

## 2018-06-17 LAB — CBC AND DIFF
Lab: 10 % — ABNORMAL LOW (ref 24–44)
Lab: 15 % — ABNORMAL HIGH (ref 11–15)
Lab: 27 % — ABNORMAL LOW (ref 40–50)
Lab: 30 pg (ref 26–34)
Lab: 4.7 10*3/uL (ref 1.8–7.0)
Lab: 454 K/UL — ABNORMAL HIGH (ref 150–400)
Lab: 6 % (ref 4–12)
Lab: 6.8 FL — ABNORMAL LOW (ref 7–11)
Lab: 79 % — ABNORMAL HIGH (ref 41–77)
Lab: 9.3 g/dL — ABNORMAL LOW (ref 13.5–16.5)
Lab: 92 FL — ABNORMAL HIGH (ref 80–100)

## 2018-06-17 MED ORDER — PROCHLORPERAZINE MALEATE 10 MG PO TAB
10 mg | ORAL_TABLET | ORAL | 3 refills | Status: AC | PRN
Start: 2018-06-17 — End: 2018-06-17

## 2018-06-17 MED ORDER — FUROSEMIDE 20 MG PO TAB
20 mg | ORAL_TABLET | Freq: Every morning | ORAL | 3 refills | 90.00000 days | Status: AC
Start: 2018-06-17 — End: ?

## 2018-06-17 MED ORDER — PROCHLORPERAZINE MALEATE 10 MG PO TAB
10 mg | ORAL_TABLET | ORAL | 3 refills | Status: AC | PRN
Start: 2018-06-17 — End: ?

## 2018-06-17 MED ORDER — ONDANSETRON HCL 8 MG PO TAB
8 mg | ORAL_TABLET | ORAL | 0 refills | 8.00000 days | Status: AC | PRN
Start: 2018-06-17 — End: ?

## 2018-06-17 MED ORDER — FUROSEMIDE 20 MG PO TAB
20 mg | ORAL_TABLET | Freq: Every morning | ORAL | 3 refills | 90.00000 days | Status: AC
Start: 2018-06-17 — End: 2018-06-17

## 2018-06-17 MED ORDER — DAROLUTAMIDE 300 MG PO TAB
600 mg | ORAL_TABLET | Freq: Two times a day (BID) | ORAL | 3 refills | Status: AC
Start: 2018-06-17 — End: 2018-06-20

## 2018-06-17 MED ORDER — LEUPROLIDE (3 MONTH) 22.5 MG IM SYKT
22.5 mg | Freq: Once | INTRAMUSCULAR | 0 refills | Status: CP
Start: 2018-06-17 — End: ?
  Administered 2018-06-17: 21:00:00 22.5 mg via INTRAMUSCULAR

## 2018-06-17 MED ORDER — ONDANSETRON HCL 8 MG PO TAB
8 mg | ORAL_TABLET | ORAL | 0 refills | 8.00000 days | Status: AC | PRN
Start: 2018-06-17 — End: 2018-06-17

## 2018-06-18 LAB — COMPREHENSIVE METABOLIC PANEL
Lab: 0.4 mg/dL (ref 0.3–1.2)
Lab: 102 MMOL/L — ABNORMAL HIGH (ref 98–110)
Lab: 139 MMOL/L — ABNORMAL HIGH (ref 137–147)
Lab: 98 mg/dL — ABNORMAL LOW (ref 70–100)
Lab: >60 mL/min (ref >60)

## 2018-06-18 LAB — PROSTATIC SPECIFIC ANTIGEN-PSA: Lab: 30 ng/mL — ABNORMAL HIGH (ref <6.01)

## 2018-06-20 ENCOUNTER — Encounter: Admit: 2018-06-20 | Discharge: 2018-06-20 | Payer: MEDICARE

## 2018-06-20 NOTE — Telephone Encounter
Oral Chemotherapy Counseling  Darolutamide (Nubeqa???)    Jose Hampton was provided medication education regarding his new oral chemotherapy.   Mr and Mrs Soderberg called the office today.  Mr Thigpen has broken his hip and is finding it difficult to get around right now.  He wanted to learn about his new oral chemotherapy pills over the phone instead of at a teach on Friday.  I have asked Watt Climes ARNP if she can also call to do her part of the education over the phone this week.    I reviewed the role of [place] specialty pharmacy, including access to medication assistance specialists if needed.     How to Take the Medication  Jose Hampton was educated on Darolutamide Merleen Hampton???) for prostate cancer in combination with a gonadotropin-releasing hormone analog (if had not received bilateral orchiectomy). Dose, route, frequency and duration of therapy were discussed.     ??? Directions: Take 600 mg (two 300 mg tablets) by mouth once daily with food until progression or unacceptable toxicity.   ??? Patient was educated to swallow tablets whole and not to crush, chew, or open tablets.     Adherence  Patient was educated on the importance of adherence and that the consequences of non-adherence could include disease progression. The patient's ability to be adherent with drug therapies was discussed and the patient was provided options for tools/resources that promote adherence to therapy. For Ronal Hampton alarms, calendars, pillboxes, and technology (reminder apps) were recommended and/or provided.     How to Store Medication  Jose Hampton was educated to store darolutamide at room temperature in a safe place away from humidity, pets, and children. I recommended storing the medication in a pill box, if needed.  I recommended if family members would be handling the medication, they should use gloves.  Additionally, I recommended cleaning any surfaces touched by darolutamide with bleach, if possible. How to Manage Missed Doses  I instructed the patient that if a dose is missed or vomited up, he should not take an extra dose to make it up. Instead, resume the medication at the next scheduled dosing time.     Contraindications / Safety Precautions / Adverse Effects  Contraindications to therapy, safety precautions, and common adverse effects (>30% incidence, except where listed below) were discussed with the patient.  I explained that most patients do NOT experience all these side effects and that this list was not inclusive.  I instructed patient to report any adverse effects to their doctor, pharmacist or nurse.     What You May Expect?  What Should You Do?    Fatigue ? Rest often? Take naps if needed. Move slowly when getting up.  ? Eat well balanced meals and drink plenty of fluids. Light exercise may help.  ? Do not drive a motor vehicle or operate machinery when feeling tired.   Muscle / Joint pain ? Take acetaminophen (Tylenol???) 650mg  orally every 3-4 hours if needed.    Diarrhea ? Drink plenty of clear fluids. Limit hot, spicy, fried foods, foods/drinks with caffeine, orange or prune juice.  ? Try a low fiber BRAT diet (Bananas, white Rice, Apple sauce, Toast made with white bread).  ? Take anti-diarrhea drug(s) if given to you by your doctor.   Increased sweating, feelings of warmth (hot flashes) ? Avoid triggers such as alcohol, caffeine (tea, coffee, cola), chocolate, hot and spicy food, stress, and heat.  ? Exercise regularly. Keep cool ??? dress lightly  and drink plenty of water   Mild swelling in arms and legs, puffiness ? Keep your feet up when sitting, eat a low-salt diet, and avoid tight-fitting clothing   Decreased white blood cells (20% incidence)   ? Report any signs of infection, fever, and unusual bleeding or bruising.   ? Phone your doctor right away or go to the nearest emergency department, if your oral temperature is over 38???C or 100.4???F (unless stated otherwise darolutamide (NUBEQA) 300 mg tablet Take two tablets by mouth twice daily with meals.   dicyclomine (BENTYL) 10 mg capsule Take 10 mg by mouth four times daily.   diphenoxylate/atropine (LOMOTIL) 2.5/0.025 mg tablet Take one tablet by mouth four times daily as needed for Diarrhea.   enzalutamide (XTANDI) 40 mg capsule Take three capsules by mouth daily. Take at the same time every day.   finasteride (PROSCAR) 5 mg tablet Take 5 mg by mouth daily.   fluocinonide (LIDEX) 0.05 % topical cream Apply to affected area as needed.   folic acid (FOLVITE) 1 mg tablet Take one tablet by mouth daily.   furosemide (LASIX) 20 mg tablet Take one tablet by mouth every morning. Indications: alternates 40mg  with 20mg  every other day   ibuprofen (MOTRIN) 400 mg tablet Take 400 mg by mouth every 8 hours as needed for Pain. Take with food.   ketotifen(+) (ZADITOR) 0.025 % (0.035 %) ophthalmic solution Apply one drop to both eyes twice daily.   LACTOBACILLUS ACIDOPHILUS (ACIDOPHILUS PO) Take  by mouth.   methylphenidate HCl (RITALIN) 5 mg tablet Take one tablet by mouth twice daily before meals   morphine IR (MS-IR) 15 mg tablet Indications: excessive pain  Take 2 tablets by mouth every three hours as needed for pain.   morphine SR (MS CONTIN) 30 mg ER tablet Take one tablet by mouth every 8 hours Indications: excessive pain   ondansetron (ZOFRAN) 8 mg tablet Take one tablet by mouth every 8 hours as needed for Nausea or Vomiting.   potassium chloride SR (K-DUR) 10 mEq tablet Take one tablet by mouth daily. Take with a meal and a full glass of water.   prochlorperazine maleate (COMPAZINE) 10 mg tablet Take one tablet by mouth every 6 hours as needed for Nausea or Vomiting.   Sulfacetamide Sodium-Sulfur 10-5 % (w/v) lotn Apply  topically to affected area.   zolpidem (AMBIEN) 10 mg tablet Take 10 mg by mouth at bedtime as needed for Sleep.       Drug-Drug Interactions (DDIs)  DDIs were evaluated: No significant drug-drug interactions were identified.     Drug-Food Interactions  Drug-food interactions were evaluated: No significant drug-food interactions were identified.     REMS Program  No REMS is required for this medication.    Reproductive Concerns:  Reproductive concerns were reviewed with the patient. As patient is a male, education will provided regarding adequate contraception for male partners of reproductive potential and contacting his physician immediately should his partner become pregnant.     What to do with any unused or expired medications  Rishard Gottsch was instructed to return any unused or expired medication to a disposal bin at one of the retail pharmacy locations or to utilize a community drug take back program. Instructed not to flush the medication down the toilet.     Monitoring  Monitoring and follow-up plan was discussed with patient. Tao Ogonowski was instructed to contact the oral chemotherapy pharmacist at [phone] if they have any questions or concerns  regarding their medication therapy. Informed the patient that we would send the prescription to a specialty pharmacy and that the pharmacy would be calling the patient to schedule a shipment. Emphasized if their phone calls were not answered, the specialty pharmacy would not ship the medication.    This medication is considered low risk per our internal oral chemotherapy risk categorization and the patient will be contacted for education, toxicity check at 2 weeks, and reassessment annually, if applicable (low risk monitoring).       Questions  Patient was given the opportunity to ask questions. Patient verbalized understanding, agreed with the plan and had no questions or concerns regarding therapy.      The patient would like this new prescription to also be sent to the [place].  The new prescription for Darolutamide 600mg  twice daily will be printed for manual fax to [facility]; [facility] Fax Number: [phone] or Phone: [phone] extension [phone]. Franchot Mimes, [facility]  Clinical Pharmacist  06/20/18

## 2018-06-23 ENCOUNTER — Encounter: Admit: 2018-06-23 | Discharge: 2018-06-23 | Payer: MEDICARE

## 2018-06-23 DIAGNOSIS — C61 Malignant neoplasm of prostate: Principal | ICD-10-CM

## 2018-06-23 MED ORDER — CYANOCOBALAMIN (VITAMIN B-12) 1,000 MCG PO TAB
1000 ug | ORAL_TABLET | Freq: Every day | ORAL | 3 refills | 29.00000 days | Status: AC
Start: 2018-06-23 — End: ?

## 2018-06-23 MED ORDER — FOLIC ACID 1 MG PO TAB
1 mg | ORAL_TABLET | Freq: Every day | ORAL | 3 refills | Status: AC
Start: 2018-06-23 — End: ?

## 2018-06-23 MED ORDER — DAROLUTAMIDE 300 MG PO TAB
600 mg | ORAL_TABLET | Freq: Two times a day (BID) | ORAL | 3 refills | Status: AC
Start: 2018-06-23 — End: 2018-07-01

## 2018-06-23 NOTE — Progress Notes
Jose Hampton is unable to come in for his teach appointment due to limited mobility from recent fractured right hip.  Teach on darolutamide completed on phone and obtained verbalized consent with Bridgett Larsson as witness.      APP Chemotherapy Education    PO Chemotherapy:  The following is a summary of the patient's PO Chemotherapy Education.    A thorough pre-assessment and teaching session explaining the mechanism of action, possible side effects, precautions and instructions regarding Nubeqa for palliative intent was conducted. The patient will initiate treatment. The cycle will repeat every 28 days unless disease progresses or unacceptable toxicity is reached. .  Plan of administration was reviewed.      Both written and verbal information were given to the patient.    The planned course of treatment, anticipated benefits, material risks and potential side effects that may occur with this course of treatment were explained to the patient.  Side effects and their management were discussed in detail and include, but are not limited to: fatigue, low WBC, nausea, elevated liver enzymes, joint pain, hot flashes.      Reproductive concerns were not discussed with the patient  Infertility risks were not applicable and therefore not discussed    Appropriate handling of body secretions and waste at home were reviewed as applicable.    Prescriptions for supportive medications including zofran were e-scripted to their pharmacy and discussed in detail how to take. Drug to drug interactions were reviewed as applicable.     The patient has received contact information for the clinic and was instructed on when and who to call.     The patient verbalized understanding, was given the opportunity to ask questions, and the consent form was signed.     Follow up appointment with physician as scheduled.    Lab on return visit.    Refilled vitamin B12 and folic acid today.  Will check into ortho referral as they have not heard from Dr. Lavell Luster office.

## 2018-06-23 NOTE — Telephone Encounter
Pt's wife called stating that [facility] never received the script for folic acid and cyanocybalomine that was sent on 05/06/18. They would like these resent to [facility] in [place]. Lauren, NP calling for consent on oral chemo. She is resending and is calling to let them know.

## 2018-06-23 NOTE — Telephone Encounter
-----   Message from Christen Bame, DO sent at 06/23/2018  9:49 AM CST -----  Please call Jose Hampton about his labs most notably his alkaline phosphatase was elevated which is probably from his bony metastases and his PSA went up to 30.71.

## 2018-06-24 ENCOUNTER — Encounter: Admit: 2018-06-24 | Discharge: 2018-06-24 | Payer: MEDICARE

## 2018-06-26 DIAGNOSIS — C7951 Secondary malignant neoplasm of bone: Secondary | ICD-10-CM

## 2018-06-26 DIAGNOSIS — C61 Malignant neoplasm of prostate: Secondary | ICD-10-CM

## 2018-07-01 ENCOUNTER — Encounter: Admit: 2018-07-01 | Discharge: 2018-07-01 | Payer: MEDICARE

## 2018-07-01 DIAGNOSIS — C61 Malignant neoplasm of prostate: Principal | ICD-10-CM

## 2018-07-01 MED ORDER — DAROLUTAMIDE 300 MG PO TAB
600 mg | ORAL_TABLET | Freq: Two times a day (BID) | ORAL | 0 refills | Status: AC
Start: 2018-07-01 — End: 2018-07-07
  Filled 2018-07-04 (×2): qty 120, 30d supply, fill #1

## 2018-07-04 ENCOUNTER — Encounter: Admit: 2018-07-04 | Discharge: 2018-07-04 | Payer: MEDICARE

## 2018-07-06 ENCOUNTER — Encounter: Admit: 2018-07-06 | Discharge: 2018-07-06 | Payer: MEDICARE

## 2018-07-07 ENCOUNTER — Encounter: Admit: 2018-07-07 | Discharge: 2018-07-07 | Payer: MEDICARE

## 2018-07-07 DIAGNOSIS — C61 Malignant neoplasm of prostate: Principal | ICD-10-CM

## 2018-07-07 MED ORDER — DAROLUTAMIDE 300 MG PO TAB
600 mg | ORAL_TABLET | Freq: Two times a day (BID) | ORAL | 3 refills | Status: AC
Start: 2018-07-07 — End: 2018-07-25

## 2018-07-07 MED ORDER — DAROLUTAMIDE 300 MG PO TAB
600 mg | ORAL_TABLET | Freq: Two times a day (BID) | ORAL | 3 refills | Status: CN
Start: 2018-07-07 — End: ?

## 2018-07-11 ENCOUNTER — Encounter: Admit: 2018-07-11 | Discharge: 2018-07-11 | Payer: MEDICARE

## 2018-07-11 DIAGNOSIS — G893 Neoplasm related pain (acute) (chronic): Principal | ICD-10-CM

## 2018-07-11 MED ORDER — MORPHINE 30 MG PO TBER
30 mg | ORAL_TABLET | ORAL | 0 refills | 7.00000 days | Status: AC
Start: 2018-07-11 — End: 2018-10-06

## 2018-07-11 MED ORDER — MORPHINE 15 MG PO TAB
ORAL_TABLET | ORAL | 0 refills | 7.00000 days | Status: AC
Start: 2018-07-11 — End: 2018-10-04

## 2018-07-19 ENCOUNTER — Encounter: Admit: 2018-07-19 | Discharge: 2018-07-19 | Payer: MEDICARE

## 2018-07-21 ENCOUNTER — Encounter: Admit: 2018-07-21 | Discharge: 2018-07-21 | Payer: MEDICARE

## 2018-07-21 DIAGNOSIS — C61 Malignant neoplasm of prostate: Principal | ICD-10-CM

## 2018-07-21 NOTE — Telephone Encounter
Patient is scheduled to see pain management doctor on 07/26/2018. They state that morphine is not helping with pain at this time. They are wondering if they can get anything else to help until he is seen next week.     This RN explained that Dr.Ernst not in the office this afternoon. I would forward message to Dr.Ernst and Tamela Oddi and they would get back to him tomorrow. They verbalized understanding and asked that if we send any new scripts in it be to [facility] in [place], [place]. I verbalized that I would relay this message. This RN encouraged pt to call with any questions or concerns. Pt verbalized understanding. Denies any further needs at this time.

## 2018-07-21 NOTE — Progress Notes
thoracic and lumbar spine, ribs, pelvis and proximal Femara and right humerus.  Activity in several joints most likely arthritic    Pathology from March 03, 2016:  Prostate left apex biopsy: Prostatic adenocarcinoma Gleason grade 5+5 equals score 10 and 1 core involving 80% of needle core tissue measuring 7 mm in length.  Perineural invasion present.  Prostate left mid biopsy:  Prostatic adenocarcinoma Gleason grade 5+5 equals score 10 and 1 core involving 80% and needle core of tissue measuring 7 mm in length.  Prostate left base biopsy:  Prostatic adenocarcinoma Gleason grade 5+5 equals score 10 and 1 core involving 60% the needle core tissue measuring 7 mm in length.  Perineural invasion present.  Prostate left lateral apex biopsy:  Prostatic adenocarcinoma Gleason grade 5+5 equals were 10 and 1 core involving 95% and needle core tissue, measuring 10 mm in length.  Prostate left lateral mid biopsy:  Prostatic adenocarcinoma Gleason grade 5+5 equals were 10 and 1 core, involving 60% and needle core tissue, measuring 6 mm in length.  Perineural invasion present.  Prostate left lateral base biopsy:  Prostatic adenocarcinoma Gleason grade 5+5 score 10 and 1 core involving 90% needle core tissue, measuring 10 mm in length.  Prostate right apex biopsy:  Prostatic adenocarcinoma Gleason grade 5+5 and glucose 410 and 1 core involving 100% and needle core tissue, measuring 11 mm in length.  Prostate right mid biopsy:  Prostatic adenocarcinoma Gleason grade 5+5 he was scored 10 and 1 core involving 80% the needle core tissue, measuring 8 mm in length.  Prostate right base biopsy:  Prostatic adenocarcinoma Gleason grade 5+5 score 10 and 1 core involving 90% and needle core tissue measuring 12 mm in length.  Prostate right lateral apex biopsy:  Prostatic adenocarcinoma Gleason grade 5+5 equals were 10 and 1 core involving 80% of the needle core tissue measuring 9 mm in length. Prostate right lateral mid biopsy: Prostatic adenocarcinoma Gleason grade 5+5 score 10 and 1 core involving 75% and needle core tissue, measuring 9 mm in length.  Prostate right lateral base biopsy: Prostatic adenocarcinoma Gleason grade 5+5 equals were 10 and 1 core involving 80% and needle core tissue, measuring 9 mm in length.    He has started a 30 day course of casodex (02/26/16) and is scheduled to begin therapy with Lupron on March 10, 2016.  He was instructed to start calcium vitamin D to total 1200 mg of calcium 810,000 international units of vitamin D daily in divided doses.  Patient is also to start docetaxel chemotherapy.  The plan is for a total of 6 cycles of docetaxel therapy.    Started docetaxel on 03/16/16      05/03/16-12/19: Admitted to [facility] for bloody diarrhea. ???He was found to have colitis, presumed to be due to treatment vs. Infection. ???He was placed on Levaquin and discharged on 12/19.???He has a hx of IBS-D.???  ???  05/16/16: Went to the ED at [facility]???for BLE swelling. ???Bilateral doppler was negative for VTE. ???He was sent home and has been using lasix for a few days for swelling.  ???  RADIATION TREATMENT SUMMARY   SITE TREATED Method Number of  Treatments Dose per  Fraction Total  Dose   L1 Through L Prox Femur  Right Proximal Femur AP-PA 18X  AP-PA 18X 5  5 400 cGy  400 cGy 2000 cGy  2000 cGy   Total Radiation Dose 2000 cGy in 5 Radiation Treatments.   Radiation Initiated  Radiation Completed 04/20/16  04/24/16  I lowered the dose of docetaxel to 60 mg/m2 secondary to leg swelling      CT of Abdomen/Pelvis 06/09/16  IMPRESSION    1. Widespread sclerotic osseous metastatic disease.  2. Normal size prostate with no abdominopelvic lymphadenopathy.  3. 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  4. Nonobstructing right renal calculus.  5. Multiple calcifications noted along the posterior wall the urinary She had a long discussion with patient that stopping prednisone is not an option his fluid retention would likely get worse without prednisone to mitigate the mineralocorticoid excess from [place].  Despite education, patient is resistant and wants prednisone stopped.    PET scan from 06/28/17:  IMPRESSION    1. ???Progression of widespread osseous metastatic disease.  2. ???No hypermetabolic lymphadenopathy.  3. ???Near resolution of retroperitoneal soft tissue thickening which may   reflect scarring from previously treated disease.  4. ???Nonobstructive right inferior pole renal calculus. Removal of   previously noted bladder calculi.    Jevtana which he started 07/29/17.    PET scan from 09/27/17:  IMPRESSION      1. ???Overall progression of widespread osseous metastatic disease involving   the axial and appendicular skeleton.  2. ???Development of a hypermetabolic subcutaneous soft tissue nodule in the   right nasolabial fold. The differential includes focal   inflammatory/infectious process, primary skin malignancy or less likely   metastatic disease. Clinical correlation and direct visualization is   recommended. Biopsy is recommended if clinically indicated.  3. ???Unchanged minimal retroperitoneal soft tissue thickening, likely   scarring from previously treated disease. No hypermetabolic thoracic or   abdominopelvic lymphadenopathy.  4. ???Small focus of increased uptake within the central aspect of the   remaining prostate gland which may be secondary to prior TURP defect.   Clinical correlation is recommended.  5. ???Stable tiny sub-5 mm pulmonary nodular densities scattered within both   lungs which are too small to characterize but unchanged from the most   remote study dated 07/10/2016 and may be secondary to granulomatous disease   or scarring. Recommend continued attention on CT follow-up.     Patient is s/p 3 cycles of jevtana on 09/09/17    Pre-[place] bone scan from October 20, 2017: ??? Prostatic Specific Antigen 06/17/2018 30.71* <6.01 NG/ML Final    Comment: REFERENCE RANGES  AGE      PSA VALUE  <50        <=1.5  50-54       <=2.0  55-59       <=3.0  60-69       <=4.0  70+        <=6.0     ??? Sodium 06/17/2018 139  137 - 147 MMOL/L Final   ??? Potassium 06/17/2018 3.8  3.5 - 5.1 MMOL/L Final   ??? Chloride 06/17/2018 102  98 - 110 MMOL/L Final   ??? Glucose 06/17/2018 98  70 - 100 MG/DL Final   ??? Blood Urea Nitrogen 06/17/2018 15  7 - 25 MG/DL Final   ??? Creatinine 06/17/2018 0.74  0.4 - 1.24 MG/DL Final   ??? Calcium 16/02/9603 8.9  8.5 - 10.6 MG/DL Final   ??? Total Protein 06/17/2018 6.1  6.0 - 8.0 G/DL Final   ??? Total Bilirubin 06/17/2018 0.4  0.3 - 1.2 MG/DL Final   ??? Albumin 54/01/8118 3.7  3.5 - 5.0 G/DL Final   ??? Alk Phosphatase 06/17/2018 388* 25 - 110 U/L Final   ???  AST (SGOT) 06/17/2018 22  7 - 40 U/L Final   ??? CO2 06/17/2018 28  21 - 30 MMOL/L Final   ??? ALT (SGPT) 06/17/2018 6* 7 - 56 U/L Final   ??? Anion Gap 06/17/2018 9  3 - 12 Final   ??? eGFR Non African American 06/17/2018 >60  >60 mL/min Final    Comment: The eGFR is not validated for use in drug dosing adjustments.  Continue to use   estimated creatinine clearance per dosing reference text.  Please contact the   Clinical Pharmacist for questions.     ??? eGFR African American 06/17/2018 >60  >60 mL/min Final    Comment: The eGFR is not validated for use in drug dosing adjustments.  Continue to use   estimated creatinine clearance per dosing reference text.  Please contact the   Clinical Pharmacist for questions.     ??? White Blood Cells 06/17/2018 6.0  4.5 - 11.0 K/UL Final   ??? RBC 06/17/2018 3.02* 4.4 - 5.5 M/UL Final   ??? Hemoglobin 06/17/2018 9.3* 13.5 - 16.5 GM/DL Final   ??? Hematocrit 06/17/2018 27.9* 40 - 50 % Final   ??? MCV 06/17/2018 92.4  80 - 100 FL Final   ??? MCH 06/17/2018 30.8  26 - 34 PG Final   ??? MCHC 06/17/2018 33.3  32.0 - 36.0 G/DL Final   ??? RDW 16/02/9603 15.5* 11 - 15 % Final

## 2018-07-22 ENCOUNTER — Encounter: Admit: 2018-07-22 | Discharge: 2018-07-22 | Payer: MEDICARE

## 2018-07-22 DIAGNOSIS — C7951 Secondary malignant neoplasm of bone: ICD-10-CM

## 2018-07-22 DIAGNOSIS — C61 Malignant neoplasm of prostate: Principal | ICD-10-CM

## 2018-07-22 MED ORDER — METHADONE 5 MG PO TAB
5 mg | ORAL_TABLET | Freq: Two times a day (BID) | ORAL | 0 refills | Status: AC
Start: 2018-07-22 — End: 2018-08-09

## 2018-07-25 ENCOUNTER — Encounter: Admit: 2018-07-25 | Discharge: 2018-07-25 | Payer: MEDICARE

## 2018-07-25 DIAGNOSIS — C61 Malignant neoplasm of prostate: Principal | ICD-10-CM

## 2018-07-25 NOTE — Progress Notes
Discontinued from Oral Oncology Patient Management Program    Jose Hampton has been removed from the Oral Oncology Patient Management Program due to [facility] insurance denial of darolutamide.    The patient's provider, Dr. Lowella Dell, has noted the above information in their note.        The patient may be re-enrolled at any time by contacting [phone].      Kerrin Champagne, [facility]  Clinical Pharmacist  07/25/2018

## 2018-07-26 ENCOUNTER — Encounter: Admit: 2018-07-26 | Discharge: 2018-07-26 | Payer: MEDICARE

## 2018-07-27 ENCOUNTER — Encounter: Admit: 2018-07-27 | Discharge: 2018-07-27 | Payer: MEDICARE

## 2018-07-29 ENCOUNTER — Encounter: Admit: 2018-07-29 | Discharge: 2018-07-29 | Payer: MEDICARE

## 2018-08-01 ENCOUNTER — Encounter: Admit: 2018-08-01 | Discharge: 2018-08-01 | Payer: MEDICARE

## 2018-08-02 ENCOUNTER — Encounter: Admit: 2018-08-02 | Discharge: 2018-08-02 | Payer: MEDICARE

## 2018-08-05 ENCOUNTER — Encounter: Admit: 2018-08-05 | Discharge: 2018-08-05 | Payer: MEDICARE

## 2018-08-09 ENCOUNTER — Encounter: Admit: 2018-08-09 | Discharge: 2018-08-09 | Payer: MEDICARE

## 2018-08-09 MED ORDER — METHADONE 10 MG PO TAB
10 mg | ORAL_TABLET | Freq: Two times a day (BID) | ORAL | 0 refills | Status: CN
Start: 2018-08-09 — End: ?

## 2018-08-09 MED ORDER — METHADONE 10 MG PO TAB
10 mg | ORAL_TABLET | Freq: Two times a day (BID) | ORAL | 0 refills | Status: AC
Start: 2018-08-09 — End: ?

## 2018-08-10 ENCOUNTER — Encounter: Admit: 2018-08-10 | Discharge: 2018-08-10 | Payer: MEDICARE

## 2018-08-10 DIAGNOSIS — M899 Disorder of bone, unspecified: Principal | ICD-10-CM

## 2018-08-11 ENCOUNTER — Encounter: Admit: 2018-08-11 | Discharge: 2018-08-12 | Payer: MEDICARE

## 2018-08-11 ENCOUNTER — Encounter: Admit: 2018-08-11 | Discharge: 2018-08-11 | Payer: MEDICARE

## 2018-08-15 ENCOUNTER — Encounter: Admit: 2018-08-15 | Discharge: 2018-08-15 | Payer: MEDICARE

## 2018-08-16 ENCOUNTER — Encounter: Admit: 2018-08-16 | Discharge: 2018-08-16 | Payer: MEDICARE

## 2018-08-23 ENCOUNTER — Encounter: Admit: 2018-08-23 | Discharge: 2018-08-23 | Payer: MEDICARE

## 2018-08-23 DIAGNOSIS — C61 Malignant neoplasm of prostate: Principal | ICD-10-CM

## 2018-08-24 ENCOUNTER — Encounter: Admit: 2018-08-24 | Discharge: 2018-08-24 | Payer: MEDICARE

## 2018-08-24 MED ORDER — MEGESTROL 20 MG PO TAB
40 mg/d | ORAL_TABLET | Freq: Every day | ORAL | 1 refills | 42.00000 days | Status: DC
Start: 2018-08-24 — End: 2018-09-05

## 2018-08-29 ENCOUNTER — Encounter: Admit: 2018-08-29 | Discharge: 2018-08-29 | Payer: MEDICARE

## 2018-08-29 DIAGNOSIS — Z87891 Personal history of nicotine dependence: ICD-10-CM

## 2018-08-29 DIAGNOSIS — C7951 Secondary malignant neoplasm of bone: ICD-10-CM

## 2018-08-29 DIAGNOSIS — Z87442 Personal history of urinary calculi: Principal | ICD-10-CM

## 2018-08-29 DIAGNOSIS — C61 Malignant neoplasm of prostate: Principal | ICD-10-CM

## 2018-08-29 DIAGNOSIS — M549 Dorsalgia, unspecified: ICD-10-CM

## 2018-08-29 DIAGNOSIS — R338 Other retention of urine: ICD-10-CM

## 2018-08-29 DIAGNOSIS — H9190 Unspecified hearing loss, unspecified ear: ICD-10-CM

## 2018-08-29 DIAGNOSIS — K580 Irritable bowel syndrome with diarrhea: ICD-10-CM

## 2018-08-29 DIAGNOSIS — K319 Disease of stomach and duodenum, unspecified: ICD-10-CM

## 2018-08-29 DIAGNOSIS — M1990 Unspecified osteoarthritis, unspecified site: ICD-10-CM

## 2018-08-29 DIAGNOSIS — F4310 Post-traumatic stress disorder, unspecified: ICD-10-CM

## 2018-08-29 DIAGNOSIS — N200 Calculus of kidney: ICD-10-CM

## 2018-08-29 DIAGNOSIS — N401 Enlarged prostate with lower urinary tract symptoms: ICD-10-CM

## 2018-08-29 NOTE — Progress Notes
Prostate right lateral mid biopsy: Prostatic adenocarcinoma Gleason grade 5+5 score 10 and 1 core involving 75% and needle core tissue, measuring 9 mm in length.  Prostate right lateral base biopsy: Prostatic adenocarcinoma Gleason grade 5+5 equals were 10 and 1 core involving 80% and needle core tissue, measuring 9 mm in length.    He has started a 30 day course of casodex (02/26/16) and is scheduled to begin therapy with Lupron on March 10, 2016.  He was instructed to start calcium vitamin D to total 1200 mg of calcium 810,000 international units of vitamin D daily in divided doses.  Patient is also to start docetaxel chemotherapy.  The plan is for a total of 6 cycles of docetaxel therapy.    Started docetaxel on 03/16/16      05/03/16-12/19: Admitted to [facility] for bloody diarrhea. ???He was found to have colitis, presumed to be due to treatment vs. Infection. ???He was placed on Levaquin and discharged on 12/19.???He has a hx of IBS-D.???  ???  05/16/16: Went to the ED at [facility]???for BLE swelling. ???Bilateral doppler was negative for VTE. ???He was sent home and has been using lasix for a few days for swelling.  ???  RADIATION TREATMENT SUMMARY   SITE TREATED Method Number of  Treatments Dose per  Fraction Total  Dose   L1 Through L Prox Femur  Right Proximal Femur AP-PA 18X  AP-PA 18X 5  5 400 cGy  400 cGy 2000 cGy  2000 cGy   Total Radiation Dose 2000 cGy in 5 Radiation Treatments.   Radiation Initiated  Radiation Completed 04/20/16  04/24/16      I lowered the dose of docetaxel to 60 mg/m2 secondary to leg swelling      CT of Abdomen/Pelvis 06/09/16  IMPRESSION    1. Widespread sclerotic osseous metastatic disease.  2. Normal size prostate with no abdominopelvic lymphadenopathy.  3. 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  4. Nonobstructing right renal calculus.  5. Multiple calcifications noted along the posterior wall the urinary She had a long discussion with patient that stopping prednisone is not an option his fluid retention would likely get worse without prednisone to mitigate the mineralocorticoid excess from [place].  Despite education, patient is resistant and wants prednisone stopped.    PET scan from 06/28/17:  IMPRESSION    1. ???Progression of widespread osseous metastatic disease.  2. ???No hypermetabolic lymphadenopathy.  3. ???Near resolution of retroperitoneal soft tissue thickening which may   reflect scarring from previously treated disease.  4. ???Nonobstructive right inferior pole renal calculus. Removal of   previously noted bladder calculi.    Jevtana which he started 07/29/17.    PET scan from 09/27/17:  IMPRESSION      1. ???Overall progression of widespread osseous metastatic disease involving   the axial and appendicular skeleton.  2. ???Development of a hypermetabolic subcutaneous soft tissue nodule in the   right nasolabial fold. The differential includes focal   inflammatory/infectious process, primary skin malignancy or less likely   metastatic disease. Clinical correlation and direct visualization is   recommended. Biopsy is recommended if clinically indicated.  3. ???Unchanged minimal retroperitoneal soft tissue thickening, likely   scarring from previously treated disease. No hypermetabolic thoracic or   abdominopelvic lymphadenopathy.  4. ???Small focus of increased uptake within the central aspect of the   remaining prostate gland which may be secondary to prior TURP defect.   Clinical correlation is  recommended.  5. ???Stable tiny sub-5 mm pulmonary nodular densities scattered within both   lungs which are too small to characterize but unchanged from the most   remote study dated 07/10/2016 and may be secondary to granulomatous disease   or scarring. Recommend continued attention on CT follow-up.     Patient is s/p 3 cycles of jevtana on 09/09/17    Pre-Xofigo bone scan from October 20, 2017: Extensive osseous metastatic disease which is a mixed response.  A few new foci of increased tracer accumulation are noted.  Multiple areas of progression are seen including the sternum, right humerus, left glenoid, and distal left femur.  Mild improvement is noted in a few areas including the thoracolumbar spine and proximal right femur and possibly the pelvis.    Patient had his first dose of Xofigo on 11/04/17.    Patient had his second dose of Xofigo on December 03, 2017.  MRI of the lumbar spine from December 14, 2017:  IMPRESSION      1. ???Extensive osseous metastatic disease involving the visualized   thoracolumbosacral spine and bony pelvis. No pathologic fracture or   extraosseous tumor is identified.  2. ???Multilevel degenerative lateral recess and foraminal stenosis, most   pronounced, of severe degree, on the left at L4-L5.  3. ???Marked degenerative disc disease and mild degenerative central spinal   stenosis at L3-L4, L4-L5, and L5-S1.  4. ???Left convexity lumbar scoliosis.  5. ???Development or progression of at least mild right hydronephrosis.    Patient had nephrostomy tubes placed on December 22, 2017.    His hemoglobin was 11.3, his white blood cell count was 6.3, and his platelets were 360,000 his ANC was 4600 on December 22, 2017.    Patient is status post 3 doses of Xofigo    PET scan from January 19, 2018:  IMPRESSION      1. Interval marked worsening of extensive widespread hypermetabolic   osseous metastatic disease throughout the axial and appendicular skeleton   as described above. Additional sclerotic lesions demonstrate no   significant uptake consistent with treated metastases.  2. No significant hypermetabolic extra osseous metastatic disease.  3. Scattered tiny subcentimeter pulmonary nodular densities without change   from 07/10/2016. These are too small to characterize by PET/CT but may   represent granulomatous disease or scarring. Recommend continued attention   on CT follow-up. ???  methadone (METHADOSE) 10 mg tablet, Take one tablet by mouth every 12 hours Indications: excessive pain for 30 days, Disp: 60 tablet, Rfl: 0  ???  methylphenidate HCl (RITALIN) 5 mg tablet, Take one tablet by mouth twice daily before meals, Disp: 60 tablet, Rfl: 0  ???  morphine IR (MS-IR) 15 mg tablet, Indications: excessive pain  Take 2 tablets by mouth every three hours as needed for pain., Disp: 480 tablet, Rfl: 0  ???  morphine SR (MS CONTIN) 30 mg ER tablet, Take one tablet by mouth every 8 hours Indications: excessive pain, Disp: 90 tablet, Rfl: 0  ???  ondansetron (ZOFRAN) 8 mg tablet, Take one tablet by mouth every 8 hours as needed for Nausea or Vomiting., Disp: 30 tablet, Rfl: 0  ???  potassium chloride SR (K-DUR) 10 mEq tablet, Take one tablet by mouth daily. Take with a meal and a full glass of water., Disp: 30 tablet, Rfl: 3  ???  prochlorperazine maleate (COMPAZINE) 10 mg tablet, Take one tablet by mouth every 6 hours as needed for Nausea or Vomiting., Disp: 90 tablet, Rfl: 3  ???  Sulfacetamide Sodium-Sulfur 10-5 % (w/v) lotn, Apply  topically to affected area., Disp: , Rfl:   ???  zolpidem (AMBIEN) 10 mg tablet, Take 10 mg by mouth at bedtime as needed for Sleep., Disp: , Rfl:     Allergy List:   Allergies   Allergen Reactions   ??? Adhesive BLISTERS   ??? Penicillin G HIVES   ??? Tegaderm BLISTERS   ??? Egg HIVES   ??? Ketotifen EYE IRRITATION   ??? Milk HIVES   ??? Codeine NAUSEA AND VOMITING   ??? Mesalamine DIARRHEA   ??? Tapentadol RASH   ??? Tylenol [Acetaminophen] DIARRHEA   ??? Bacitracin UNKNOWN   ??? Diphenhydramine UNKNOWN   ??? Doxepin UNKNOWN   ??? Doxycycline STOMACH UPSET   ??? Pravastatin DIARRHEA   ??? Rifaximin DIARRHEA   ??? Rosuvastatin DIARRHEA   ??? Simvastatin DIARRHEA and UNKNOWN       Social History:   Social History     Socioeconomic History   ??? Marital status: Married     Spouse name: Not on file   ??? Number of children: Not on file   ??? Years of education: Not on file   ??? Highest education level: Not on file

## 2018-09-01 ENCOUNTER — Ambulatory Visit: Admit: 2018-09-01 | Discharge: 2018-09-15 | Payer: MEDICARE

## 2018-09-05 ENCOUNTER — Encounter: Admit: 2018-09-05 | Discharge: 2018-09-05 | Payer: MEDICARE

## 2018-09-05 DIAGNOSIS — Z87891 Personal history of nicotine dependence: ICD-10-CM

## 2018-09-05 DIAGNOSIS — D649 Anemia, unspecified: Secondary | ICD-10-CM

## 2018-09-05 DIAGNOSIS — M549 Dorsalgia, unspecified: ICD-10-CM

## 2018-09-05 DIAGNOSIS — H9190 Unspecified hearing loss, unspecified ear: ICD-10-CM

## 2018-09-05 DIAGNOSIS — R531 Weakness: ICD-10-CM

## 2018-09-05 DIAGNOSIS — Z87442 Personal history of urinary calculi: Principal | ICD-10-CM

## 2018-09-05 DIAGNOSIS — K319 Disease of stomach and duodenum, unspecified: ICD-10-CM

## 2018-09-05 DIAGNOSIS — R630 Anorexia: ICD-10-CM

## 2018-09-05 DIAGNOSIS — R339 Retention of urine, unspecified: ICD-10-CM

## 2018-09-05 DIAGNOSIS — C61 Malignant neoplasm of prostate: Principal | ICD-10-CM

## 2018-09-05 DIAGNOSIS — F4310 Post-traumatic stress disorder, unspecified: ICD-10-CM

## 2018-09-05 DIAGNOSIS — R220 Localized swelling, mass and lump, head: ICD-10-CM

## 2018-09-05 DIAGNOSIS — C7951 Secondary malignant neoplasm of bone: ICD-10-CM

## 2018-09-05 DIAGNOSIS — M1990 Unspecified osteoarthritis, unspecified site: ICD-10-CM

## 2018-09-05 DIAGNOSIS — K1379 Other lesions of oral mucosa: ICD-10-CM

## 2018-09-05 MED ORDER — RXAMB DIPH/LIDO/ANTACID 1:1:1 (COMPOUND)
10 mL | Freq: Three times a day (TID) | ORAL | 3 refills | Status: AC
Start: 2018-09-05 — End: ?

## 2018-09-05 MED ORDER — MEGESTROL 400 MG/10 ML (40 MG/ML) PO SUSP
160 mg/d | Freq: Four times a day (QID) | ORAL | 3 refills | 42.00000 days | Status: AC
Start: 2018-09-05 — End: ?

## 2018-09-05 NOTE — Progress Notes
Name: Jose Hampton          MRN: [id number]      DOB: 1946/04/01      AGE: 73 y.o.   DATE OF SERVICE: 09/05/2018    Subjective:             Reason for Visit:   No chief complaint on file.  Obtained patient's verbal consent to treat them and their agreement to [facility] financial policy and NPP via this telephone visit during the [facility] Emergency        Jose Hampton is a 73 y.o. male who presents to my oncology clinic for follow up for his metastatic prostate cancer.      History of Present Illness  Oncological history:  He has long-standing chronic pain for which was previously followed and a pain clinic and had been receiving injections for his back, shoulder and leg pain.  He was a transfer care to pain clinic in [facility] and so had an MRI of the lumbar spine performed which showed a number lesions in the lumbar vertebral bodies suspicious for sclerotic metastatic lesions.  He further had this worked up with a bone scan which showed extensive metastatic disease involving the thoracic and lumbar spine, ribs, pelvis proximal Femur and right humerus.  Patient had not had a prostate biopsy performed and noted to urology that his last rectal exam was approximately 3 years ago.  Patient stated that he had surgery to his rectum to urology to make him have loose stools for multiple days after having a rectal exam done.    Per urology's notes his PSA was 9.4.    An MRI of the cervical spine from January 13, 2016:  Spondylolysis C4 through C6.  Straightening of the lordotic curvature and evidence of subtle retrolisthesis of C4 upon C5.  C4-C5 severe degenerative disc disease with central osteophyte or disc complex.  C6-C7 mild degenerative disc disease.    MRI of the lumbar spine from January 13, 2016:  There are multiple rounded lesions in the lumbar vertebral bodies with decreased T1-T2 signal suspicious for sclerotic metastatic lesions. 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  4. Nonobstructing right renal calculus.  5. Multiple calcifications noted along the posterior wall the urinary   bladder which is incompletely distended. These likely represent multiple   small layering bladder calculi. However, partial calcified posterior   bladder wall mass cannot be excluded. Clinical correlation is recommended.   Correlation with bladder ultrasound is recommended if clinically   indicated.  6. Mild colonic diverticulosis and small hiatal hernia.    Echo from 06/17/16:  Interpretation Summary     Left ventricular systolic function is within normal limits.  LVEF 60%  No significant valvular abnormalities.  No pericardial effusion.   Normal diastolic function.     PET scan from 07/10/16:  IMPRESSION    ???  1. ???Low level FDG uptake within minimal retroperitoneal soft tissue   thickening, which is not significantly changed in size since CT abdomen   pelvis 06/09/2016 and is favored to reflect small reactive lymph nodes or   treated disease. Recommend attention on follow-up CT or possibly   fluciclovine (Axumin) PET/CT.  2. ???No thoracic adenopathy.  3. ???Widespread osteosclerotic metastatic disease, some of which   demonstrate low level increased FDG uptake.  4. ???Nonobstructing right renal calculus and redemonstration of multiple   layering calcifications dependently or along the posterior bladder wall   favored to reflect bladder calculi. However  a partially calcified   posterior bladder wall mass cannot be excluded. Clinical correlation   recommended.  5. ???If further evaluation of the patient's known prostate carcinoma is   clinically indicated, correlation with fluciclovine (Axumin) PET/CT is   recommended.    Path from 10/05/16:  Bladder tumor:  Involved by prostatic adenocarcinoma    PET scan from November 10, 2016:  Retroperitoneal soft tissue thickening lightly lymphadenopathy is similar ??? Milk HIVES   ??? Codeine NAUSEA AND VOMITING   ??? Mesalamine DIARRHEA   ??? Tapentadol RASH   ??? Tylenol [Acetaminophen] DIARRHEA   ??? Bacitracin UNKNOWN   ??? Diphenhydramine UNKNOWN   ??? Doxepin UNKNOWN   ??? Doxycycline STOMACH UPSET   ??? Pravastatin DIARRHEA   ??? Rifaximin DIARRHEA   ??? Rosuvastatin DIARRHEA   ??? Simvastatin DIARRHEA and UNKNOWN       Social History:   Social History     Socioeconomic History   ??? Marital status: Married     Spouse name: Not on file   ??? Number of children: Not on file   ??? Years of education: Not on file   ??? Highest education level: Not on file   Occupational History   ??? Not on file   Tobacco Use   ??? Smoking status: Former Smoker     Last attempt to quit: 12/28/1990     Years since quitting: 27.7   ??? Smokeless tobacco: Never Used   Substance and Sexual Activity   ??? Alcohol use: No     Alcohol/week: 0.0 standard drinks   ??? Drug use: No   ??? Sexual activity: Not on file   Other Topics Concern   ??? Not on file   Social History Narrative   ??? Not on file       Family History:   Family History   Problem Relation Age of Onset   ??? Asthma Sister    ??? Asthma Brother    ??? Cancer Other    ??? Asthma Child    ??? Cancer Other             Review of Systems   Constitutional: Positive for appetite change and fatigue. Negative for chills, diaphoresis, fever and unexpected weight change.   HENT: Positive for dental problem (tooth abscess). Negative for congestion.    Eyes: Positive for photophobia, discharge and itching. Negative for visual disturbance.   Respiratory: Positive for cough. Negative for shortness of breath.    Cardiovascular: Negative for chest pain and leg swelling.   Gastrointestinal: Positive for constipation, diarrhea and nausea. Negative for vomiting.   Genitourinary: Positive for difficulty urinating.   Musculoskeletal: Positive for arthralgias, back pain, myalgias, neck pain and neck stiffness.   Skin: Negative for rash.   Neurological: Positive for light-headedness. Negative for headaches. radiation on 12/23/16 to the right hip for pain control and he completed the radiation on 12/31/16.  Scans from March 09, 2017 as above. PET scan from 06/28/17 showed progression of widespread osseous metastatic disease.  Since there was progression, I changed to Jevtana which he started 07/29/17. Patient is s/p 3 cycles of jevtana on 09/09/17. His PET scan from 09/27/17 showed progression of his osseous mets and I transitioned from [place] to [place]. Pre- Xofigo bone scan from 10/20/17 as above. Patient had his first dose of Xofigo on 11/04/17.  He had a third injection of Xofigo on January 03, 2018.  Most recent PSA was 14.18 on November 25, 2017.   PET scan  from January 19, 2018 showed interval marked worsening of extensive widespread hypermetabolic osseous metastatic disease throughout the skeleton.  I transitioned from [place] and rechallenge with docetaxel at a 25% decrease as he had profound edema previously with docetaxel.  I spoke to the patient and he would rather try docetaxel versus Xtandi.  However, if he progresses on the rechallenge of the docetaxel I would transition him to [place].  Patient received his first dosage of docetaxel rechallenge on January 28, 2018.  Duwayne underwent a PET scan on March 21, 2018 which showed overall worsening of the widespread hypermetabolic osseous metastatic disease throughout the axial and appendicular skeleton including multiple lesions within the proximal humeri and femurs bilaterally.  He has status post 3 cycles of the docetaxel rechallenge on March 11, 2018.  With this progression of disease, I transitioned [place] from the docetaxel to Xtandi 160 mg p.o. daily and I will continue his Lupron as scheduled. Tayo started [place] on 04/15/18. With his profound fatigue, I cut his xtandi dose to 120 mg PO daily.  CT abdomen and pelvis and a bone scan from June 13, 2018 showed progression disseminated osseous metastatic disease.  I will transition him from the [place] which

## 2018-09-06 ENCOUNTER — Encounter: Admit: 2018-09-06 | Discharge: 2018-09-06 | Payer: MEDICARE

## 2018-09-07 ENCOUNTER — Encounter: Admit: 2018-09-07 | Discharge: 2018-09-07 | Payer: MEDICARE

## 2018-09-07 DIAGNOSIS — M549 Dorsalgia, unspecified: ICD-10-CM

## 2018-09-07 DIAGNOSIS — H9190 Unspecified hearing loss, unspecified ear: ICD-10-CM

## 2018-09-07 DIAGNOSIS — F4310 Post-traumatic stress disorder, unspecified: ICD-10-CM

## 2018-09-07 DIAGNOSIS — Z87442 Personal history of urinary calculi: Principal | ICD-10-CM

## 2018-09-07 DIAGNOSIS — C61 Malignant neoplasm of prostate: Principal | ICD-10-CM

## 2018-09-07 DIAGNOSIS — K319 Disease of stomach and duodenum, unspecified: ICD-10-CM

## 2018-09-07 DIAGNOSIS — M1990 Unspecified osteoarthritis, unspecified site: ICD-10-CM

## 2018-09-07 NOTE — Progress Notes
Telehealth Visit Note    Date of Service: 09/07/2018    Radiation oncology telehealth visit consultation note    Obtained patient's verbal consent to treat them and their agreement to [facility] financial policy and NPP via this telehealth visit during the Coronavirus Public Health Emergency    Participants:  Elby Showers, MD (host)  Jose Hampton (patient)  Loetta Rough (Patient's wife)  Kaylyn Lim (Patient's stepdaughter)       Jose Hampton is a 73 y.o. male.    History of Present Illness  The patient is a 73 year old gentleman with a history of metastatic prostate cancer.  Due to the coronavirus pandemic, he is being seen today via telehealth in the presence of his wife Moss Seurer, and his stepdaughter Kaylyn Lim.    -The patient was diagnosed with metastatic prostate cancer in October 2017.  Prior to that, he had been followed in a pain clinic for chronic pain involving the back, shoulders, and legs.  Upon transferring care to a pain clinic in [place], an MRI of the lumbar spine was performed which showed multiple suspicious lesions in the vertebral bodies concerning for metastatic cancer.  Further work-up including a bone scan showed diffuse evidence of bone metastases.  His PSA was reportedly 9.4.      -Biopsy of the prostate was performed March 03, 2016 and showed Gleason 5+5 = 10 prostate cancer involving 80 to 90% of the tissue submitted from all 12 of 12 cores.  Perineural invasion was also identified.      -He was started on Casodex, Lupron, and docetaxel.      -He received palliative radiation therapy to the L1 through left proximal femur and the right proximal femur to a total of 2000 cGy in 5 treatments between April 20, 2016 and April 24, 2016.      -PET/CT in February 2018 showed widespread osteosclerotic metastatic disease and findings concerning for a bladder mass.      -Pathology from the bladder mass Oct 05, 2016 confirmed involvement with prostatic adenocarcinoma.  J ??? methylphenidate HCl (RITALIN) 5 mg tablet Take one tablet by mouth twice daily before meals   ??? morphine IR (MS-IR) 15 mg tablet Indications: excessive pain  Take 2 tablets by mouth every three hours as needed for pain.   ??? morphine SR (MS CONTIN) 30 mg ER tablet Take one tablet by mouth every 8 hours Indications: excessive pain   ??? ondansetron (ZOFRAN) 8 mg tablet Take one tablet by mouth every 8 hours as needed for Nausea or Vomiting.   ??? potassium chloride SR (K-DUR) 10 mEq tablet Take one tablet by mouth daily. Take with a meal and a full glass of water.   ??? prochlorperazine maleate (COMPAZINE) 10 mg tablet Take one tablet by mouth every 6 hours as needed for Nausea or Vomiting.   ??? Sulfacetamide Sodium-Sulfur 10-5 % (w/v) lotn Apply  topically to affected area.   ??? zolpidem (AMBIEN) 10 mg tablet Take 10 mg by mouth at bedtime as needed for Sleep.     Vitals:    09/07/18 1130   PainSc: Eight     There is no height or weight on file to calculate BMI.     Physical Exam  Unable to perform formal physical exam due to telehealth visit.  Visually, the patient appears chronically ill and is not able to communicate well verbally.              Imaging:   August 11, 2018 CT head [facility]): Impression: New bifrontal lesions compatible with expected postoperative change of cingulotomy.  #2 hemorrhagic mass centered in the right temporal lobe with associated vasogenic edema and minimal local mass-effect is not significantly changed. Compatible with metastasis versus primary malignancy.  No substantial midline shift.  No other sites of intracranial hemorrhage.  No other areas of infarct.  No hydrocephalus.    #3 right skull base sphenoid lytic lesion compatible with metastasis.      Pathology:  As in HPI prostate cancer Gleason 5+5 = 10           Assessment and Plan:  The patient is a 73 year old gentleman with metastatic prostate cancer and

## 2018-09-09 ENCOUNTER — Encounter: Admit: 2018-09-09 | Discharge: 2018-09-09 | Payer: MEDICARE

## 2018-09-09 ENCOUNTER — Ambulatory Visit: Admit: 2018-09-09 | Discharge: 2018-09-09 | Payer: MEDICARE

## 2018-09-09 DIAGNOSIS — N99528 Other complication of other external stoma of urinary tract: Principal | ICD-10-CM

## 2018-09-09 DIAGNOSIS — Z886 Allergy status to analgesic agent status: ICD-10-CM

## 2018-09-09 DIAGNOSIS — M1990 Unspecified osteoarthritis, unspecified site: ICD-10-CM

## 2018-09-09 DIAGNOSIS — Z881 Allergy status to other antibiotic agents status: ICD-10-CM

## 2018-09-09 DIAGNOSIS — Z880 Allergy status to penicillin: ICD-10-CM

## 2018-09-09 DIAGNOSIS — F4310 Post-traumatic stress disorder, unspecified: ICD-10-CM

## 2018-09-09 DIAGNOSIS — C61 Malignant neoplasm of prostate: Secondary | ICD-10-CM

## 2018-09-09 DIAGNOSIS — R531 Weakness: Secondary | ICD-10-CM

## 2018-09-09 DIAGNOSIS — M549 Dorsalgia, unspecified: ICD-10-CM

## 2018-09-09 DIAGNOSIS — Z885 Allergy status to narcotic agent status: ICD-10-CM

## 2018-09-09 DIAGNOSIS — N135 Crossing vessel and stricture of ureter without hydronephrosis: ICD-10-CM

## 2018-09-09 DIAGNOSIS — K319 Disease of stomach and duodenum, unspecified: ICD-10-CM

## 2018-09-09 DIAGNOSIS — C7951 Secondary malignant neoplasm of bone: ICD-10-CM

## 2018-09-09 DIAGNOSIS — Z87442 Personal history of urinary calculi: Principal | ICD-10-CM

## 2018-09-09 DIAGNOSIS — N131 Hydronephrosis with ureteral stricture, not elsewhere classified: Principal | ICD-10-CM

## 2018-09-09 DIAGNOSIS — D649 Anemia, unspecified: ICD-10-CM

## 2018-09-09 DIAGNOSIS — H9190 Unspecified hearing loss, unspecified ear: ICD-10-CM

## 2018-09-09 LAB — IRON + BINDING CAPACITY + %SAT+ FERRITIN
Lab: 145 ug/dL — ABNORMAL LOW (ref 270–380)
Lab: 22 % — ABNORMAL LOW (ref 28–42)
Lab: 32 ug/dL — ABNORMAL LOW (ref >60)
Lab: >75 ng/mL — ABNORMAL HIGH (ref 30–300)

## 2018-09-09 LAB — CBC AND DIFF
Lab: 32 g/dL (ref 32.0–36.0)
Lab: 9.8 10*3/uL (ref 4.5–11.0)

## 2018-09-09 LAB — MAGNESIUM: Lab: 2.2 mg/dL (ref 1.6–2.6)

## 2018-09-09 LAB — COMPREHENSIVE METABOLIC PANEL: Lab: 143 MMOL/L (ref 137–147)

## 2018-09-09 MED ORDER — MIDAZOLAM 1 MG/ML IJ SOLN
1 mg | Freq: Once | INTRAVENOUS | 0 refills | Status: DC
Start: 2018-09-09 — End: 2018-09-09

## 2018-09-09 MED ORDER — IOHEXOL 300 MG IODINE/ML IV SOLN
20 mL | Freq: Once | 0 refills | Status: CP
Start: 2018-09-09 — End: ?
  Administered 2018-09-09: 19:00:00 20 mL

## 2018-09-09 MED ORDER — CEFTRIAXONE INJ 1GM IVP
1 g | Freq: Once | INTRAVENOUS | 0 refills | Status: DC
Start: 2018-09-09 — End: 2018-09-09

## 2018-09-09 MED ORDER — SODIUM CHLORIDE 0.9 % IJ SYRG
10 mL | Freq: Two times a day (BID) | INTRAVENOUS | 0 refills | Status: DC
Start: 2018-09-09 — End: 2018-09-09

## 2018-09-09 MED ORDER — FENTANYL CITRATE (PF) 50 MCG/ML IJ SOLN
0 refills | Status: CP
Start: 2018-09-09 — End: ?
  Administered 2018-09-09: 19:00:00 25 ug via INTRAVENOUS

## 2018-09-09 NOTE — Patient Instructions
INTERVENTIONAL RADIOLOGY  Discharge Instructions???  Percutaneous Nephrostomy Catheter Change  Please follow these instructions and precautions after your nephrostomy tube change.  ???POST-PROCEDURE ACTIVITY:  ??? A responsible adult must drive you home after the procedure.??? You should not drive, operate heavy machinery or do anything that requires concentration for at least 24 hours after receiving sedation.???  ??? Avoid any strenuous activity that may affect the tube.??? Avoid pushing, pulling or straining.  ??? Do not lift more than 5 lbs. for one week.  ??? Avoid any activity that causes tension or pulling on the catheter and avoid bending or crimping the tube.??? Never use scissors, pins or other sharp objects near the tube.  POST-PROCEDURE SITE CARE:  ??? You will have a bandage around the tube site.??? Keep this clean and dry.  Cleaning your site:  ??? For the first two weeks after placement, the bandage around the tube should be changed every 2 days or more often if it becomes wet or soiled.??? Always keep the skin around the tube clean and covered with a clean, dry bandage.  ? Clean around the tube with mild soap and clean gauze.???When cleaning, start by cleaning in a circular motion around the tube site and work outward for 3-4 inches.  ? Rinse with saline or water and gently pat dry.  ? Place dry gauze around the tube and secure bandage and tube with tape or an occlusive dressing such as tegaderm???.  ??? Always wash your hands thoroughly before handling the tube or touching near the tube site.??? Also wash your hands before and after emptying the drainage bag.???  ??? You may shower during the first 2 weeks but you must cover the tube site carefully with plastic wrap to keep dry.??? Otherwise you may take sponge baths, taking care to keep the bandage/tube site dry.??? If the bandage becomes wet or soiled, it should be changed immediately.  ??? After 14 days, you should remove the bandage before showering and shower with the bandage off ??? You have severe pain not relieved by pain medication.??? Some soreness/tenderness is to be expected at the site for several days after tube placement.  ???For any of the above symptoms or for problems or concerns related to the procedure,??? call??? [phone] for Monday-Friday 7-5.??? After-hours and weekends, please call??? [phone] and ask for the Interventional Radiology Resident on-call.  You or your caregiver should call 911 for severe symptoms such as excessive bleeding, severe dizziness, chest pain, shortness of breath or loss of consciousness.

## 2018-09-09 NOTE — H&P (View-Only)
Pre Procedure History and Physical/Sedation Plan      Procedure Date:  09/09/2018    Planned Procedure(s): Right nephrostomy tube exchange    Indication for exam: Routine exchange; sutures out, possibly partially displaced  ________________________________________________________________    Chief Complaint:   Prostate cancer, bladder outlet obstruction    Previous Anesthetic/Sedation History:  Reviewed    Allergies:  Adhesive; Penicillin g; Tegaderm; Egg; Ketotifen; Milk; Codeine; Mesalamine; Tapentadol; Tylenol [acetaminophen]; Bacitracin; Diphenhydramine; Doxepin; Doxycycline; Pravastatin; Rifaximin; Rosuvastatin; and Simvastatin  Medications:  Scheduled Meds:Continuous Infusions:  PRN and Respiratory Meds:       Vital Signs:  Last Filed Vital Signs: 24 Hour Range   BP: 111/73 (04/24 1230)  Pulse: 119 (04/24 1230)  Respirations: 17 PER MINUTE (04/24 1230)  SpO2: 96 % (04/24 1230)  SpO2 Pulse: 119 (04/24 1230) BP: (111)/(73)   Pulse:  [119]   Respirations:  [17 PER MINUTE]   SpO2:  [96 %]      Pre-procedure anxiolysis plan: Midazolam  Sedation/Medication Plan: Fentanyl, Lidocaine and Midazolam  Personal history of sedation complications: Denies adverse event.   Family history of sedation complications: Denies adverse event.   Medications for Reversal: Naloxone and Flumazenil  Discussion/Reviews:  Physician has discussed risks and alternatives of this type of sedation and above planned procedures with patient    NPO Status: Acceptable  Airway:  airway assessment performed  Mallampati II (soft palate, uvula, fauces visible)  Head and Neck: no abnormalities noted  Mouth: no abnormalities noted   Anesthesia Classification:  ASA III (A patient with a severe systemic disease that limits activity, but is not incapacitating)  Pregnancy Status: N/A    Lab/Radiology/Other Diagnostic Tests  Labs:  Relevant labs reviewed    I have examined the patient, and there are no significant changes in their

## 2018-09-13 ENCOUNTER — Encounter: Admit: 2018-09-13 | Discharge: 2018-09-13 | Payer: MEDICARE

## 2018-09-13 DIAGNOSIS — M1990 Unspecified osteoarthritis, unspecified site: ICD-10-CM

## 2018-09-13 DIAGNOSIS — R338 Other retention of urine: ICD-10-CM

## 2018-09-13 DIAGNOSIS — R58 Hemorrhage, not elsewhere classified: ICD-10-CM

## 2018-09-13 DIAGNOSIS — C7951 Secondary malignant neoplasm of bone: ICD-10-CM

## 2018-09-13 DIAGNOSIS — C61 Malignant neoplasm of prostate: Principal | ICD-10-CM

## 2018-09-13 DIAGNOSIS — R630 Anorexia: ICD-10-CM

## 2018-09-13 DIAGNOSIS — T451X5A Adverse effect of antineoplastic and immunosuppressive drugs, initial encounter: ICD-10-CM

## 2018-09-13 DIAGNOSIS — N401 Enlarged prostate with lower urinary tract symptoms: ICD-10-CM

## 2018-09-13 DIAGNOSIS — K319 Disease of stomach and duodenum, unspecified: ICD-10-CM

## 2018-09-13 DIAGNOSIS — M549 Dorsalgia, unspecified: ICD-10-CM

## 2018-09-13 DIAGNOSIS — K589 Irritable bowel syndrome without diarrhea: ICD-10-CM

## 2018-09-13 DIAGNOSIS — F4310 Post-traumatic stress disorder, unspecified: ICD-10-CM

## 2018-09-13 DIAGNOSIS — R609 Edema, unspecified: ICD-10-CM

## 2018-09-13 DIAGNOSIS — H9190 Unspecified hearing loss, unspecified ear: ICD-10-CM

## 2018-09-13 DIAGNOSIS — Z87442 Personal history of urinary calculi: Principal | ICD-10-CM

## 2018-09-13 NOTE — Progress Notes
Call placed to [facility] at [facility] and orders, facesheet, and H&P sent to them.

## 2018-09-14 ENCOUNTER — Encounter: Admit: 2018-09-14 | Discharge: 2018-09-14 | Payer: MEDICARE

## 2018-09-15 ENCOUNTER — Ambulatory Visit: Admit: 2018-09-07 | Discharge: 2018-09-07 | Payer: MEDICARE

## 2018-09-15 DIAGNOSIS — C7951 Secondary malignant neoplasm of bone: ICD-10-CM

## 2018-09-15 DIAGNOSIS — C7931 Secondary malignant neoplasm of brain: ICD-10-CM

## 2018-09-15 DIAGNOSIS — C61 Malignant neoplasm of prostate: Principal | ICD-10-CM

## 2018-09-16 ENCOUNTER — Ambulatory Visit: Admit: 2018-09-16 | Discharge: 2018-09-30 | Payer: MEDICARE

## 2018-09-20 ENCOUNTER — Encounter: Admit: 2018-09-20 | Discharge: 2018-09-20 | Payer: MEDICARE

## 2018-09-28 ENCOUNTER — Encounter: Admit: 2018-09-28 | Discharge: 2018-09-28 | Payer: MEDICARE

## 2018-10-04 ENCOUNTER — Encounter: Admit: 2018-10-04 | Discharge: 2018-10-04 | Payer: MEDICARE

## 2018-10-04 MED ORDER — MORPHINE 15 MG PO TAB
ORAL_TABLET | ORAL | 0 refills | 7.00000 days | Status: DC
Start: 2018-10-04 — End: 2018-10-06

## 2018-10-04 NOTE — Telephone Encounter
Robin with [facility] called to get patients Roxanol increased from 5mg /hr to 20mg /hour and orders to flush nephrostomy tube 3-4 times/day. Zella Ball also requested refill on MS-IR 15MG  Tabs. RN electronically sent refill.

## 2018-10-06 ENCOUNTER — Encounter: Admit: 2018-10-06 | Discharge: 2018-10-06 | Payer: MEDICARE

## 2018-10-06 MED ORDER — MORPHINE 15 MG PO TAB
ORAL_TABLET | ORAL | 0 refills | 7.00000 days | Status: AC
Start: 2018-10-06 — End: ?

## 2018-10-07 ENCOUNTER — Encounter: Admit: 2018-10-07 | Discharge: 2018-10-07 | Payer: MEDICARE

## 2018-10-07 MED ORDER — MORPHINE CONCENTRATE 100 MG/5 ML (20 MG/ML) PO SOLN
20 mg | ORAL | 0 refills | 7.00000 days | Status: AC | PRN
Start: 2018-10-07 — End: ?

## 2018-10-07 MED ORDER — METHADONE 10 MG/ML PO CONC
10 mg | ORAL | 0 refills | Status: AC
Start: 2018-10-07 — End: ?

## 2018-10-11 ENCOUNTER — Encounter: Admit: 2018-10-11 | Discharge: 2018-10-11 | Payer: MEDICARE

## 2018-10-17 DEATH — deceased

## 2018-11-23 ENCOUNTER — Encounter: Admit: 2018-11-23 | Discharge: 2018-11-23

## 2018-12-22 ENCOUNTER — Encounter: Admit: 2018-12-22 | Discharge: 2018-12-22

## 2019-06-27 ENCOUNTER — Encounter: Admit: 2019-06-27 | Discharge: 2019-06-27 | Payer: MEDICARE

## 2019-07-31 ENCOUNTER — Encounter: Admit: 2019-07-31 | Discharge: 2019-07-31 | Payer: MEDICARE

## 2021-07-02 ENCOUNTER — Encounter: Admit: 2021-07-02 | Discharge: 2021-07-02 | Payer: MEDICARE
